# Patient Record
Sex: Male | Born: 1970 | Race: White | Hispanic: No | Marital: Married | Smoking: Never smoker
Health system: Southern US, Community
[De-identification: ages and names within clinical notes are randomized; demographics above are authoritative.]

## PROBLEM LIST (undated history)

## (undated) DIAGNOSIS — R7303 Prediabetes: Secondary | ICD-10-CM

## (undated) DIAGNOSIS — I10 Essential (primary) hypertension: Secondary | ICD-10-CM

## (undated) DIAGNOSIS — M9112 Juvenile osteochondrosis of head of femur [Legg-Calve-Perthes], left leg: Secondary | ICD-10-CM

## (undated) DIAGNOSIS — G4730 Sleep apnea, unspecified: Secondary | ICD-10-CM

## (undated) HISTORY — PX: HIP SURGERY: SHX245

---
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
## 2014-05-19 ENCOUNTER — Encounter (HOSPITAL_COMMUNITY): Payer: Self-pay | Admitting: Emergency Medicine

## 2014-05-19 ENCOUNTER — Emergency Department (HOSPITAL_COMMUNITY): Payer: Managed Care, Other (non HMO)

## 2014-05-19 ENCOUNTER — Emergency Department (HOSPITAL_COMMUNITY)
Admission: EM | Admit: 2014-05-19 | Discharge: 2014-05-20 | Disposition: A | Discharge status: Home / Self Care | Payer: Managed Care, Other (non HMO) | Attending: Emergency Medicine | Admitting: Emergency Medicine

## 2014-05-19 DIAGNOSIS — R519 Headache, unspecified: Secondary | ICD-10-CM

## 2014-05-19 DIAGNOSIS — G4700 Insomnia, unspecified: Secondary | ICD-10-CM | POA: Insufficient documentation

## 2014-05-19 DIAGNOSIS — Z79899 Other long term (current) drug therapy: Secondary | ICD-10-CM | POA: Insufficient documentation

## 2014-05-19 DIAGNOSIS — R0602 Shortness of breath: Secondary | ICD-10-CM | POA: Insufficient documentation

## 2014-05-19 DIAGNOSIS — M542 Cervicalgia: Secondary | ICD-10-CM | POA: Diagnosis not present

## 2014-05-19 DIAGNOSIS — R079 Chest pain, unspecified: Secondary | ICD-10-CM | POA: Insufficient documentation

## 2014-05-19 DIAGNOSIS — R51 Headache: Secondary | ICD-10-CM | POA: Insufficient documentation

## 2014-05-19 LAB — BASIC METABOLIC PANEL
Anion gap: 8 (ref 5–15)
BUN: 13 mg/dL (ref 6–23)
CALCIUM: 9.6 mg/dL (ref 8.4–10.5)
CO2: 28 mmol/L (ref 19–32)
Chloride: 102 mmol/L (ref 96–112)
Creatinine, Ser: 0.86 mg/dL (ref 0.50–1.35)
GFR calc Af Amer: >90 mL/min (ref >90)
Glucose, Bld: 98 mg/dL (ref 70–99)
Potassium: 3.5 mmol/L (ref 3.5–5.1)
SODIUM: 138 mmol/L (ref 135–145)

## 2014-05-19 LAB — CBC
HCT: 43.8 % (ref 39.0–52.0)
HEMOGLOBIN: 15.1 g/dL (ref 13.0–17.0)
MCH: 30.3 pg (ref 26.0–34.0)
MCHC: 34.5 g/dL (ref 30.0–36.0)
MCV: 88 fL (ref 78.0–100.0)
Platelets: 294 10*3/uL (ref 150–400)
RBC: 4.98 MIL/uL (ref 4.22–5.81)
RDW: 12.7 % (ref 11.5–15.5)
WBC: 6.5 10*3/uL (ref 4.0–10.5)

## 2014-05-19 LAB — TROPONIN I

## 2014-05-19 MED ORDER — ASPIRIN 81 MG PO CHEW
324.0000 mg | CHEWABLE_TABLET | Freq: Once | ORAL | Status: AC
  Administered 2014-05-19: 324 mg via ORAL
  Filled 2014-05-19: qty 4

## 2014-05-19 NOTE — ED Notes (Signed)
Pt reports high blood pressure,dyspnea,headache,blurred vision since Tuesday. Pt reports chest tightness since this am. Pt non-diaphoretic.

## 2014-05-19 NOTE — ED Notes (Signed)
MD at bedside. 

## 2014-05-19 NOTE — ED Provider Notes (Addendum)
CSN: [id number]     Arrival date & time 05/19/14  1211 History  This chart was scribed for Nicholas Sorrow, MD by Peyton Bottoms, ED Scribe. This patient was seen in room APA04/APA04 and the patient's care was started at 1:56 PM.   Chief Complaint  Patient presents with  . Chest Pain   Patient is a 44 y.o. male presenting with chest pain. The history is provided by the patient. No language interpreter was used.  Chest Pain Pain location:  Unable to specify Pain quality: tightness   Pain radiates to:  Does not radiate Pain radiates to the back: no   Pain severity:  Moderate Onset quality:  Gradual Duration:  1 day Timing:  Constant Progression:  Waxing and waning Chronicity:  New Relieved by:  Nothing Worsened by:  Nothing tried Associated symptoms: headache and shortness of breath   Associated symptoms: no abdominal pain, no back pain, no cough, no fever, no nausea and not vomiting    HPI Comments: Nicholas Newton is a 44 y.o. male with a PMHx of hip surgery, and diabetes, who presents to the Emergency Department complaining of moderate headache and neck stiffness that began 4 days ago. Patient also reports associated onset of blurry vision which began later that day. His BP was around 150/64. Patient states that headache, neck stiffness and blurry vision worsened earlier today. He also had onset of constant substernal chest pain and tightness that began earlier today at 9 AM. He states that the pain has been constant until after arriving to ED. He reports associated SOB. He states he checked his BP at pharmacy earlier today and states that it was high. He states that he was seen by PCP 5 days ago and his BP was normal. Patient report family history of heart problems. He denies associated nausea or vomiting. Patient states he began taking Metformin 3 days ago. Patient denies use of anticoagulant medication.  History reviewed. No pertinent past medical history. Past Surgical History   Procedure Laterality Date  . Hip surgery     History reviewed. No pertinent family history. History  Substance Use Topics  . Smoking status: Never Smoker   . Smokeless tobacco: Not on file  . Alcohol Use: No   Review of Systems  Constitutional: Negative for fever and chills.  HENT: Negative for congestion, rhinorrhea and sore throat.   Eyes: Positive for visual disturbance.  Respiratory: Positive for shortness of breath. Negative for cough.   Cardiovascular: Positive for chest pain. Negative for leg swelling.  Gastrointestinal: Negative for nausea, vomiting, abdominal pain and diarrhea.  Genitourinary: Negative for dysuria.  Musculoskeletal: Positive for neck pain and neck stiffness. Negative for back pain.  Skin: Negative for rash.  Neurological: Positive for headaches.  Hematological: Does not bruise/bleed easily.  Psychiatric/Behavioral: Negative for confusion.   Allergies  Review of patient's allergies indicates no known allergies.  Home Medications   Prior to Admission medications   Medication Sig Start Date End Date Taking? Authorizing Provider  amphetamine-dextroamphetamine (ADDERALL XR) 20 MG 24 hr capsule Take 20 mg by mouth daily. 05/02/14  Yes Historical Provider, MD  BIOTIN PO Take 1 tablet by mouth daily.   Yes Historical Provider, MD  FOLIC ACID PO Take 1 tablet by mouth daily.   Yes Historical Provider, MD  GARLIC PO Take 1 capsule by mouth daily.   Yes Historical Provider, MD  metFORMIN (GLUCOPHAGE) 500 MG tablet Take 500 mg by mouth daily. 05/16/14  Yes Historical Provider, MD  Omega-3 Fatty Acids (FISH OIL) 1200 MG CAPS Take 2,400 mg by mouth daily.   Yes Historical Provider, MD  VITAMIN A PO Take 1 tablet by mouth daily.   Yes Historical Provider, MD  Vitamin D, Ergocalciferol, (DRISDOL) 50000 UNITS CAPS capsule Take 1 capsule by mouth once a week. Wednesday 04/09/14   Historical Provider, MD   Triage Vitals: BP 139/96 mmHg  Pulse 79  Resp 16  Ht 5' 11"   (1.803 m)  Wt 240 lb (108.863 kg)  BMI 33.49 kg/m2  SpO2 98%  Physical Exam  Constitutional: He is oriented to person, place, and time. He appears well-developed and well-nourished.  HENT:  Head: Normocephalic and atraumatic.  Mouth/Throat: Oropharynx is clear and moist. No oropharyngeal exudate.  Eyes: EOM are normal. Pupils are equal, round, and reactive to light. No scleral icterus.  Cardiovascular: Normal rate, regular rhythm and normal heart sounds.   No murmur heard. Pulmonary/Chest: Effort normal and breath sounds normal.  Abdominal: Soft. Bowel sounds are normal. There is no tenderness.  Musculoskeletal: Normal range of motion. He exhibits no edema or tenderness.  Neurological: He is alert and oriented to person, place, and time. No cranial nerve deficit. He exhibits normal muscle tone. Coordination normal.  Skin: Skin is warm and dry.  Psychiatric: He has a normal mood and affect. His behavior is normal.  Nursing note and vitals reviewed.  ED Course  Procedures (including critical care time)]   Medications  aspirin chewable tablet 324 mg (324 mg Oral Given 05/19/14 1427)    DIAGNOSTIC STUDIES: Oxygen Saturation is 98% on RA, normal by my interpretation.    COORDINATION OF CARE: 2:05 PM- Discussed plans to order diagnostic CXR, EKG and lab work. Pt advised of plan for treatment and pt agrees.  Results for orders placed or performed during the hospital encounter of 05/19/14  CBC  Result Value Ref Range   WBC 6.5 4.0 - 10.5 K/uL   RBC 4.98 4.22 - 5.81 MIL/uL   Hemoglobin 15.1 13.0 - 17.0 g/dL   HCT 43.8 39.0 - 52.0 %   MCV 88.0 78.0 - 100.0 fL   MCH 30.3 26.0 - 34.0 pg   MCHC 34.5 30.0 - 36.0 g/dL   RDW 12.7 11.5 - 15.5 %   Platelets 294 150 - 400 K/uL  Basic metabolic panel  Result Value Ref Range   Sodium 138 135 - 145 mmol/L   Potassium 3.5 3.5 - 5.1 mmol/L   Chloride 102 96 - 112 mmol/L   CO2 28 19 - 32 mmol/L   Glucose, Bld 98 70 - 99 mg/dL   BUN 13 6 -  23 mg/dL   Creatinine, Ser 0.86 0.50 - 1.35 mg/dL   Calcium 9.6 8.4 - 10.5 mg/dL   GFR calc non Af Amer >90 >90 mL/min   GFR calc Af Amer >90 >90 mL/min   Anion gap 8 5 - 15  Troponin I (MHP)  Result Value Ref Range   Troponin I <0.03 <0.031 ng/mL  Troponin I  Result Value Ref Range   Troponin I <0.03 <0.031 ng/mL   Ct Head Wo Contrast  05/19/2014   CLINICAL DATA:  Headache.  EXAM: CT HEAD WITHOUT CONTRAST  TECHNIQUE: Contiguous axial images were obtained from the base of the skull through the vertex without intravenous contrast.  COMPARISON:  None.  FINDINGS: Bony calvarium appears intact. No mass effect or midline shift is noted. Ventricular size is within normal limits. There is no evidence of intracranial mass  lesion, hemorrhage or acute infarction. 1.7 cm subcutaneous lesion is seen over left frontal skull which most likely is benign.  IMPRESSION: No acute intracranial abnormality seen.   Electronically Signed   By: Sabino Dick M.D.   On: 05/19/2014 14:55   Dg Chest Port 1 View  05/19/2014   CLINICAL DATA:  Acute chest pain.  EXAM: PORTABLE CHEST - 1 VIEW  COMPARISON:  None.  FINDINGS: The heart size and mediastinal contours are within normal limits. Both lungs are clear. No pneumothorax or pleural effusion is noted. The visualized skeletal structures are unremarkable.  IMPRESSION: No acute cardiopulmonary abnormality seen.   Electronically Signed   By: Sabino Dick M.D.   On: 05/19/2014 12:53   MDM   Final diagnoses:  Headache  Chest pain, unspecified chest pain type    Patient meets criteria for admission due to the chest pain. Patient with onset of chest pain 9 this morning still present but improved after aspirin. Patient has risk factors of age male and diabetes. Patient refuses admission he understands the potential consequences worse case scenario could result in death from a cardiac event. Patient still once to go home and will sign out AMA.  Patients first 2 troponins were  negative which is good EKG also had no acute changes. Workup for the headache CT negative. That all suggestive of perhaps a viral type illness has been bothering for the past few weeks. Blood pressure improved significantly here without any direct treatment. Systolic down to 683. Patient does have primary care doctor to follow-up with.  I personally performed the services described in this documentation, which was scribed in my presence. The recorded information has been reviewed and is accurate.  Nicholas Sorrow, MD 05/19/14 [place], MD 05/19/14 [phone]

## 2014-05-19 NOTE — ED Notes (Signed)
Pt walked out after doctor spoke with him about admission.

## 2014-06-07 ENCOUNTER — Encounter (INDEPENDENT_AMBULATORY_CARE_PROVIDER_SITE_OTHER): Payer: Self-pay | Admitting: *Deleted

## 2014-06-27 ENCOUNTER — Encounter (INDEPENDENT_AMBULATORY_CARE_PROVIDER_SITE_OTHER): Payer: Self-pay | Admitting: Internal Medicine

## 2014-06-27 ENCOUNTER — Ambulatory Visit (INDEPENDENT_AMBULATORY_CARE_PROVIDER_SITE_OTHER): Payer: Managed Care, Other (non HMO) | Admitting: Internal Medicine

## 2014-06-27 VITALS — BP 108/62 | HR 72 | Temp 97.1°F | Ht 71.0 in | Wt 241.1 lb

## 2014-06-27 DIAGNOSIS — I10 Essential (primary) hypertension: Secondary | ICD-10-CM

## 2014-06-27 DIAGNOSIS — K625 Hemorrhage of anus and rectum: Secondary | ICD-10-CM

## 2014-06-27 NOTE — Progress Notes (Signed)
   Subjective:    Patient ID: Nicholas Newton, male    DOB: 07/07/1970, 44 y.o.   MRN: [id number]  HPI Referred to our office by Micheline Rough for blood in stool. ([place]). Underwent physical in January and found to have Heme positive stool card. He tells me he occasionally see blood in his stools. Occurs once every 2-3 weeks.  Has had this rectal bleeding off and on for about 2 yrs.  No family hx of colon cancer. Appetite is good. No weight loss.  No abdominal pain . Usually has a BM at least once a day and sometimes two a day.  Patient denies being a diabetic.    Review of Systems   Married, Two children in good health. Works at [employer].  No past medical history on file.    No Known Allergies    No past medical history on file.    No Known Allergies  Current Outpatient Prescriptions on File Prior to Visit  Medication Sig Dispense Refill  . FOLIC ACID PO Take 1 tablet by mouth daily.    Marland Kitchen GARLIC PO Take 1 capsule by mouth daily.    . Omega-3 Fatty Acids (FISH OIL) 1200 MG CAPS Take 2,400 mg by mouth daily.    . Vitamin D, Ergocalciferol, (DRISDOL) 50000 UNITS CAPS capsule Take 1 capsule by mouth once a week. Wednesday    . VITAMIN A PO Take 1 tablet by mouth daily. Ginger rool one a day     No current facility-administered medications on file prior to visit.         Objective:   Physical Exam Blood pressure 108/62, pulse 72, temperature 97.1 F (36.2 C), height 5' 11"  (1.803 m), weight 241 lb 1.6 oz (109.362 kg).  Alert and oriented. Skin warm and dry. Oral mucosa is moist.   . Sclera anicteric, conjunctivae is pink. Thyroid not enlarged. No cervical lymphadenopathy. Lungs clear. Heart regular rate and rhythm.  Abdomen is soft. Bowel sounds are positive. No hepatomegaly. No abdominal masses felt. No tenderness.  No edema to lower extremities.        Assessment & Plan:  Blood in stool. Colonic neoplasm needs to be ruled. Hemorrhoids also in the  differential.  Colonoscopy: The risks and benefits such as perforation, bleeding, and infection were reviewed with the patient and is agreeable.

## 2014-06-27 NOTE — Patient Instructions (Signed)
Colonoscopy.  The risks and benefits such as perforation, bleeding, and infection were reviewed with the patient and is agreeable. 

## 2014-06-28 ENCOUNTER — Other Ambulatory Visit (INDEPENDENT_AMBULATORY_CARE_PROVIDER_SITE_OTHER): Payer: Self-pay | Admitting: *Deleted

## 2014-06-28 ENCOUNTER — Telehealth (INDEPENDENT_AMBULATORY_CARE_PROVIDER_SITE_OTHER): Payer: Self-pay | Admitting: *Deleted

## 2014-06-28 DIAGNOSIS — R195 Other fecal abnormalities: Secondary | ICD-10-CM

## 2014-06-28 DIAGNOSIS — Z1211 Encounter for screening for malignant neoplasm of colon: Secondary | ICD-10-CM

## 2014-06-28 DIAGNOSIS — K625 Hemorrhage of anus and rectum: Secondary | ICD-10-CM

## 2014-06-28 MED ORDER — PEG 3350-KCL-NA BICARB-NACL 420 G PO SOLR: 4000.0000 mL | Freq: Once | ORAL | Status: DC

## 2014-06-28 NOTE — Telephone Encounter (Signed)
Patient needs trilyte 

## 2014-07-24 ENCOUNTER — Telehealth (INDEPENDENT_AMBULATORY_CARE_PROVIDER_SITE_OTHER): Payer: Self-pay | Admitting: *Deleted

## 2014-07-24 NOTE — Telephone Encounter (Signed)
Patient called to cancel TCS for this Thursday (4/7) -- states it will cost him 400.00 and he can't afford this, states "he thought it would be free", TCS has been canceled

## 2014-07-25 NOTE — Telephone Encounter (Signed)
noted 

## 2014-07-26 ENCOUNTER — Ambulatory Visit (HOSPITAL_COMMUNITY)
Admission: RE | Admit: 2014-07-26 | Payer: Managed Care, Other (non HMO) | Source: Ambulatory Visit | Admitting: Internal Medicine

## 2014-07-26 ENCOUNTER — Encounter (HOSPITAL_COMMUNITY): Admission: RE | Payer: Self-pay | Source: Ambulatory Visit

## 2014-07-26 SURGERY — COLONOSCOPY
Anesthesia: Moderate Sedation

## 2015-12-09 ENCOUNTER — Ambulatory Visit: Payer: Managed Care, Other (non HMO) | Attending: Internal Medicine | Admitting: Neurology

## 2015-12-09 DIAGNOSIS — G4710 Hypersomnia, unspecified: Secondary | ICD-10-CM

## 2015-12-09 DIAGNOSIS — G4733 Obstructive sleep apnea (adult) (pediatric): Secondary | ICD-10-CM | POA: Insufficient documentation

## 2015-12-15 NOTE — Procedures (Signed)
Dayton A. Merlene Laughter, MD     [email or web address]             NOCTURNAL POLYSOMNOGRAPHY   LOCATION: [facility]  Patient Name: Nicholas Newton, Nicholas Newton Date: 12/09/2015 Gender: Male D.O.B: 1970/09/09 Age (years): 45 Referring Provider: Delphina Cahill Height (inches): 70 Interpreting Physician: Phillips Odor MD, ABSM Weight (lbs): 235 RPSGT: Rosebud Poles BMI: 34 MRN: [id number] Neck Size: 17.00 CLINICAL INFORMATION Sleep Study Type: Split Night CPAP Indication for sleep study: N/A Epworth Sleepiness Score: 5 SLEEP STUDY TECHNIQUE As per the AASM Manual for the Scoring of Sleep and Associated Events v2.3 (April 2016) with a hypopnea requiring 4% desaturations. The channels recorded and monitored were frontal, central and occipital EEG, electrooculogram (EOG), submentalis EMG (chin), nasal and oral airflow, thoracic and abdominal wall motion, anterior tibialis EMG, snore microphone, electrocardiogram, and pulse oximetry. Continuous positive airway pressure (CPAP) was initiated when the patient met split night criteria and was titrated according to treat sleep-disordered breathing. MEDICATIONS Medications taken by the patient : N/A Medications administered by patient during sleep study : No sleep medicine administered.  Current Outpatient Prescriptions:  .  FOLIC ACID PO, Take 1 tablet by mouth daily., Disp: , Rfl:  .  GARLIC PO, Take 1 capsule by mouth daily., Disp: , Rfl:  .  Omega-3 Fatty Acids (FISH OIL) 1200 MG CAPS, Take 2,400 mg by mouth daily., Disp: , Rfl:  .  polyethylene glycol-electrolytes (NULYTELY/GOLYTELY) 420 G solution, Take 4,000 mLs by mouth once., Disp: 4000 mL, Rfl: 0 .  VITAMIN A PO, Take 1 tablet by mouth daily. Ginger rool one a day, Disp: , Rfl:  .  Vitamin D, Ergocalciferol, (DRISDOL) 50000 UNITS CAPS capsule, Take 1 capsule by mouth once a week. Wednesday, Disp: , Rfl:   RESPIRATORY PARAMETERS Diagnostic Total AHI (/hr): 19.4 RDI  (/hr): 19.4 OA Index (/hr): 7.4 CA Index (/hr): 0.0 REM AHI (/hr): 3.7 NREM AHI (/hr): 22.3 Supine AHI (/hr): 20.2 Non-supine AHI (/hr): 17.64 Min O2 Sat (%): 81.00 Mean O2 (%): 95.27 Time below 88% (min): 5.9   Titration Optimal Pressure (cm): 10 AHI at Optimal Pressure (/hr): 0.4 Min O2 at Optimal Pressure (%): 93.0 Supine % at Optimal (%): 80 Sleep % at Optimal (%): 92   SLEEP ARCHITECTURE The recording time for the entire night was 405.7 minutes. During a baseline period of 405.7 minutes, the patient slept for 318.3 minutes in REM and nonREM, yielding a sleep efficiency of 78.5%. Sleep onset after lights out was 34.3 minutes with a REM latency of 99.5 minutes. The patient spent 3.93% of the night in stage N1 sleep, 54.61% in stage N2 sleep, 26.07% in stage N3 and 15.39% in REM. During the titration period of 0.0 minutes, the patient slept for 0.0 minutes in REM and nonREM, yielding a sleep efficiency of N/A%. Sleep onset after CPAP initiation was N/A minutes with a REM latency of N/A minutes. The patient spent N/A% of the night in stage N1 sleep, N/A% in stage N2 sleep, N/A% in stage N3 and N/A% in REM. CARDIAC DATA The 2 lead EKG demonstrated sinus rhythm. The mean heart rate was N/A beats per minute. Other EKG findings include: None. LEG MOVEMENT DATA The total Periodic Limb Movements of Sleep (PLMS) were 0. The PLMS index was 0.00.    IMPRESSIONS - Moderate obstructive sleep apnea occurred during the diagnostic portion of the study(AHI = 19.4/hour). An optimal PAP pressure was selected for this patient ( 10 cm of water).  Delano Metz, MD Diplomate, American Board of Sleep Medicine.

## 2017-11-17 DIAGNOSIS — M9903 Segmental and somatic dysfunction of lumbar region: Secondary | ICD-10-CM | POA: Diagnosis not present

## 2017-11-17 DIAGNOSIS — M542 Cervicalgia: Secondary | ICD-10-CM | POA: Diagnosis not present

## 2017-11-17 DIAGNOSIS — M9901 Segmental and somatic dysfunction of cervical region: Secondary | ICD-10-CM | POA: Diagnosis not present

## 2018-01-11 DIAGNOSIS — I10 Essential (primary) hypertension: Secondary | ICD-10-CM | POA: Diagnosis not present

## 2018-01-11 DIAGNOSIS — E782 Mixed hyperlipidemia: Secondary | ICD-10-CM | POA: Diagnosis not present

## 2018-01-11 DIAGNOSIS — E291 Testicular hypofunction: Secondary | ICD-10-CM | POA: Diagnosis not present

## 2018-01-11 DIAGNOSIS — R7301 Impaired fasting glucose: Secondary | ICD-10-CM | POA: Diagnosis not present

## 2018-01-14 DIAGNOSIS — Z23 Encounter for immunization: Secondary | ICD-10-CM | POA: Diagnosis not present

## 2018-01-14 DIAGNOSIS — E782 Mixed hyperlipidemia: Secondary | ICD-10-CM | POA: Diagnosis not present

## 2018-01-14 DIAGNOSIS — R7301 Impaired fasting glucose: Secondary | ICD-10-CM | POA: Diagnosis not present

## 2018-01-14 DIAGNOSIS — E291 Testicular hypofunction: Secondary | ICD-10-CM | POA: Diagnosis not present

## 2018-02-08 DIAGNOSIS — M545 Low back pain: Secondary | ICD-10-CM | POA: Diagnosis not present

## 2018-02-08 DIAGNOSIS — M9903 Segmental and somatic dysfunction of lumbar region: Secondary | ICD-10-CM | POA: Diagnosis not present

## 2018-02-08 DIAGNOSIS — M9901 Segmental and somatic dysfunction of cervical region: Secondary | ICD-10-CM | POA: Diagnosis not present

## 2018-02-14 DIAGNOSIS — M9901 Segmental and somatic dysfunction of cervical region: Secondary | ICD-10-CM | POA: Diagnosis not present

## 2018-02-14 DIAGNOSIS — M545 Low back pain: Secondary | ICD-10-CM | POA: Diagnosis not present

## 2018-02-14 DIAGNOSIS — M9903 Segmental and somatic dysfunction of lumbar region: Secondary | ICD-10-CM | POA: Diagnosis not present

## 2018-02-22 DIAGNOSIS — M545 Low back pain: Secondary | ICD-10-CM | POA: Diagnosis not present

## 2018-02-22 DIAGNOSIS — M9903 Segmental and somatic dysfunction of lumbar region: Secondary | ICD-10-CM | POA: Diagnosis not present

## 2018-02-22 DIAGNOSIS — M9901 Segmental and somatic dysfunction of cervical region: Secondary | ICD-10-CM | POA: Diagnosis not present

## 2018-03-02 DIAGNOSIS — M545 Low back pain: Secondary | ICD-10-CM | POA: Diagnosis not present

## 2018-03-02 DIAGNOSIS — M9901 Segmental and somatic dysfunction of cervical region: Secondary | ICD-10-CM | POA: Diagnosis not present

## 2018-03-02 DIAGNOSIS — M9903 Segmental and somatic dysfunction of lumbar region: Secondary | ICD-10-CM | POA: Diagnosis not present

## 2018-03-30 DIAGNOSIS — M9901 Segmental and somatic dysfunction of cervical region: Secondary | ICD-10-CM | POA: Diagnosis not present

## 2018-03-30 DIAGNOSIS — M545 Low back pain: Secondary | ICD-10-CM | POA: Diagnosis not present

## 2018-03-30 DIAGNOSIS — M9903 Segmental and somatic dysfunction of lumbar region: Secondary | ICD-10-CM | POA: Diagnosis not present

## 2018-05-10 DIAGNOSIS — E782 Mixed hyperlipidemia: Secondary | ICD-10-CM | POA: Diagnosis not present

## 2018-05-10 DIAGNOSIS — R945 Abnormal results of liver function studies: Secondary | ICD-10-CM | POA: Diagnosis not present

## 2018-05-10 DIAGNOSIS — I10 Essential (primary) hypertension: Secondary | ICD-10-CM | POA: Diagnosis not present

## 2018-05-10 DIAGNOSIS — R7301 Impaired fasting glucose: Secondary | ICD-10-CM | POA: Diagnosis not present

## 2018-05-10 DIAGNOSIS — E291 Testicular hypofunction: Secondary | ICD-10-CM | POA: Diagnosis not present

## 2018-05-13 DIAGNOSIS — M545 Low back pain: Secondary | ICD-10-CM | POA: Diagnosis not present

## 2018-05-13 DIAGNOSIS — R7301 Impaired fasting glucose: Secondary | ICD-10-CM | POA: Diagnosis not present

## 2018-05-13 DIAGNOSIS — Z6833 Body mass index (BMI) 33.0-33.9, adult: Secondary | ICD-10-CM | POA: Diagnosis not present

## 2018-05-13 DIAGNOSIS — E291 Testicular hypofunction: Secondary | ICD-10-CM | POA: Diagnosis not present

## 2018-05-13 DIAGNOSIS — I10 Essential (primary) hypertension: Secondary | ICD-10-CM | POA: Diagnosis not present

## 2018-05-13 DIAGNOSIS — E782 Mixed hyperlipidemia: Secondary | ICD-10-CM | POA: Diagnosis not present

## 2018-09-19 DIAGNOSIS — I10 Essential (primary) hypertension: Secondary | ICD-10-CM | POA: Diagnosis not present

## 2018-09-19 DIAGNOSIS — R945 Abnormal results of liver function studies: Secondary | ICD-10-CM | POA: Diagnosis not present

## 2018-09-19 DIAGNOSIS — E782 Mixed hyperlipidemia: Secondary | ICD-10-CM | POA: Diagnosis not present

## 2018-09-19 DIAGNOSIS — R7301 Impaired fasting glucose: Secondary | ICD-10-CM | POA: Diagnosis not present

## 2018-09-19 DIAGNOSIS — E291 Testicular hypofunction: Secondary | ICD-10-CM | POA: Diagnosis not present

## 2018-09-30 DIAGNOSIS — E782 Mixed hyperlipidemia: Secondary | ICD-10-CM | POA: Diagnosis not present

## 2018-09-30 DIAGNOSIS — R7301 Impaired fasting glucose: Secondary | ICD-10-CM | POA: Diagnosis not present

## 2018-09-30 DIAGNOSIS — E291 Testicular hypofunction: Secondary | ICD-10-CM | POA: Diagnosis not present

## 2018-09-30 DIAGNOSIS — I10 Essential (primary) hypertension: Secondary | ICD-10-CM | POA: Diagnosis not present

## 2018-09-30 DIAGNOSIS — M545 Low back pain: Secondary | ICD-10-CM | POA: Diagnosis not present

## 2019-03-28 DIAGNOSIS — R7301 Impaired fasting glucose: Secondary | ICD-10-CM | POA: Diagnosis not present

## 2019-03-28 DIAGNOSIS — I10 Essential (primary) hypertension: Secondary | ICD-10-CM | POA: Diagnosis not present

## 2019-03-28 DIAGNOSIS — E782 Mixed hyperlipidemia: Secondary | ICD-10-CM | POA: Diagnosis not present

## 2019-03-28 DIAGNOSIS — E291 Testicular hypofunction: Secondary | ICD-10-CM | POA: Diagnosis not present

## 2019-04-05 DIAGNOSIS — M545 Low back pain: Secondary | ICD-10-CM | POA: Diagnosis not present

## 2019-04-05 DIAGNOSIS — E291 Testicular hypofunction: Secondary | ICD-10-CM | POA: Diagnosis not present

## 2019-04-05 DIAGNOSIS — E782 Mixed hyperlipidemia: Secondary | ICD-10-CM | POA: Diagnosis not present

## 2019-04-05 DIAGNOSIS — I10 Essential (primary) hypertension: Secondary | ICD-10-CM | POA: Diagnosis not present

## 2019-04-05 DIAGNOSIS — R7301 Impaired fasting glucose: Secondary | ICD-10-CM | POA: Diagnosis not present

## 2019-08-05 DIAGNOSIS — L239 Allergic contact dermatitis, unspecified cause: Secondary | ICD-10-CM | POA: Diagnosis not present

## 2019-10-02 DIAGNOSIS — E291 Testicular hypofunction: Secondary | ICD-10-CM | POA: Diagnosis not present

## 2019-10-02 DIAGNOSIS — E782 Mixed hyperlipidemia: Secondary | ICD-10-CM | POA: Diagnosis not present

## 2019-10-02 DIAGNOSIS — E6609 Other obesity due to excess calories: Secondary | ICD-10-CM | POA: Diagnosis not present

## 2019-10-02 DIAGNOSIS — M545 Low back pain: Secondary | ICD-10-CM | POA: Diagnosis not present

## 2019-10-02 DIAGNOSIS — I10 Essential (primary) hypertension: Secondary | ICD-10-CM | POA: Diagnosis not present

## 2019-10-02 DIAGNOSIS — M9110 Juvenile osteochondrosis of head of femur [Legg-Calve-Perthes], unspecified leg: Secondary | ICD-10-CM | POA: Diagnosis not present

## 2019-10-02 DIAGNOSIS — G4710 Hypersomnia, unspecified: Secondary | ICD-10-CM | POA: Diagnosis not present

## 2019-10-02 DIAGNOSIS — L600 Ingrowing nail: Secondary | ICD-10-CM | POA: Diagnosis not present

## 2019-10-02 DIAGNOSIS — L239 Allergic contact dermatitis, unspecified cause: Secondary | ICD-10-CM | POA: Diagnosis not present

## 2019-10-09 DIAGNOSIS — I10 Essential (primary) hypertension: Secondary | ICD-10-CM | POA: Diagnosis not present

## 2019-10-09 DIAGNOSIS — R7301 Impaired fasting glucose: Secondary | ICD-10-CM | POA: Diagnosis not present

## 2019-10-09 DIAGNOSIS — E782 Mixed hyperlipidemia: Secondary | ICD-10-CM | POA: Diagnosis not present

## 2019-10-09 DIAGNOSIS — M545 Low back pain: Secondary | ICD-10-CM | POA: Diagnosis not present

## 2019-10-09 DIAGNOSIS — E291 Testicular hypofunction: Secondary | ICD-10-CM | POA: Diagnosis not present

## 2020-08-27 DIAGNOSIS — R519 Headache, unspecified: Secondary | ICD-10-CM | POA: Diagnosis not present

## 2020-08-27 DIAGNOSIS — I10 Essential (primary) hypertension: Secondary | ICD-10-CM | POA: Diagnosis not present

## 2020-08-27 DIAGNOSIS — R232 Flushing: Secondary | ICD-10-CM | POA: Diagnosis not present

## 2020-09-18 DIAGNOSIS — R7301 Impaired fasting glucose: Secondary | ICD-10-CM | POA: Diagnosis not present

## 2020-09-18 DIAGNOSIS — I10 Essential (primary) hypertension: Secondary | ICD-10-CM | POA: Diagnosis not present

## 2020-10-02 DIAGNOSIS — Z0001 Encounter for general adult medical examination with abnormal findings: Secondary | ICD-10-CM | POA: Diagnosis not present

## 2020-10-02 DIAGNOSIS — Z1211 Encounter for screening for malignant neoplasm of colon: Secondary | ICD-10-CM | POA: Diagnosis not present

## 2020-10-02 DIAGNOSIS — R7303 Prediabetes: Secondary | ICD-10-CM | POA: Diagnosis not present

## 2020-10-02 DIAGNOSIS — E291 Testicular hypofunction: Secondary | ICD-10-CM | POA: Diagnosis not present

## 2020-10-02 DIAGNOSIS — I10 Essential (primary) hypertension: Secondary | ICD-10-CM | POA: Diagnosis not present

## 2020-10-02 DIAGNOSIS — M545 Low back pain, unspecified: Secondary | ICD-10-CM | POA: Diagnosis not present

## 2020-10-02 DIAGNOSIS — E782 Mixed hyperlipidemia: Secondary | ICD-10-CM | POA: Diagnosis not present

## 2020-10-14 DIAGNOSIS — Z1211 Encounter for screening for malignant neoplasm of colon: Secondary | ICD-10-CM | POA: Diagnosis not present

## 2020-10-20 LAB — COLOGUARD: COLOGUARD: NEGATIVE

## 2021-04-04 DIAGNOSIS — R7301 Impaired fasting glucose: Secondary | ICD-10-CM | POA: Diagnosis not present

## 2021-04-04 DIAGNOSIS — I10 Essential (primary) hypertension: Secondary | ICD-10-CM | POA: Diagnosis not present

## 2021-04-10 ENCOUNTER — Other Ambulatory Visit (HOSPITAL_COMMUNITY): Payer: Self-pay | Admitting: Family Medicine

## 2021-04-10 ENCOUNTER — Other Ambulatory Visit: Payer: Self-pay

## 2021-04-10 ENCOUNTER — Ambulatory Visit (HOSPITAL_COMMUNITY)
Admission: RE | Admit: 2021-04-10 | Discharge: 2021-04-10 | Disposition: A | Discharge status: Home / Self Care | Payer: 59 | Source: Ambulatory Visit | Attending: Family Medicine | Admitting: Family Medicine

## 2021-04-10 DIAGNOSIS — I10 Essential (primary) hypertension: Secondary | ICD-10-CM | POA: Diagnosis not present

## 2021-04-10 DIAGNOSIS — E782 Mixed hyperlipidemia: Secondary | ICD-10-CM | POA: Diagnosis not present

## 2021-04-10 DIAGNOSIS — E669 Obesity, unspecified: Secondary | ICD-10-CM | POA: Diagnosis not present

## 2021-04-10 DIAGNOSIS — R7303 Prediabetes: Secondary | ICD-10-CM | POA: Diagnosis not present

## 2021-04-10 DIAGNOSIS — M545 Low back pain, unspecified: Secondary | ICD-10-CM | POA: Diagnosis not present

## 2021-04-10 DIAGNOSIS — M47816 Spondylosis without myelopathy or radiculopathy, lumbar region: Secondary | ICD-10-CM | POA: Diagnosis not present

## 2021-04-10 DIAGNOSIS — E291 Testicular hypofunction: Secondary | ICD-10-CM | POA: Diagnosis not present

## 2021-04-10 DIAGNOSIS — M5442 Lumbago with sciatica, left side: Secondary | ICD-10-CM

## 2021-04-28 DIAGNOSIS — M5442 Lumbago with sciatica, left side: Secondary | ICD-10-CM | POA: Diagnosis not present

## 2021-04-28 DIAGNOSIS — I10 Essential (primary) hypertension: Secondary | ICD-10-CM | POA: Diagnosis not present

## 2021-04-28 DIAGNOSIS — Z6834 Body mass index (BMI) 34.0-34.9, adult: Secondary | ICD-10-CM | POA: Diagnosis not present

## 2021-05-02 ENCOUNTER — Other Ambulatory Visit: Payer: Self-pay | Admitting: Neurosurgery

## 2021-05-02 ENCOUNTER — Other Ambulatory Visit (HOSPITAL_COMMUNITY): Payer: Self-pay | Admitting: Neurosurgery

## 2021-05-02 DIAGNOSIS — M5442 Lumbago with sciatica, left side: Secondary | ICD-10-CM

## 2021-05-15 ENCOUNTER — Encounter (HOSPITAL_COMMUNITY): Payer: Self-pay

## 2021-05-15 ENCOUNTER — Ambulatory Visit (HOSPITAL_COMMUNITY): Payer: 59

## 2021-06-06 ENCOUNTER — Other Ambulatory Visit: Payer: Self-pay

## 2021-06-06 ENCOUNTER — Ambulatory Visit (HOSPITAL_COMMUNITY)
Admission: RE | Admit: 2021-06-06 | Discharge: 2021-06-06 | Disposition: A | Discharge status: Home / Self Care | Payer: No Typology Code available for payment source | Source: Ambulatory Visit | Attending: Neurosurgery | Admitting: Neurosurgery

## 2021-06-06 DIAGNOSIS — M5442 Lumbago with sciatica, left side: Secondary | ICD-10-CM | POA: Insufficient documentation

## 2021-06-06 DIAGNOSIS — M5440 Lumbago with sciatica, unspecified side: Secondary | ICD-10-CM | POA: Diagnosis not present

## 2021-06-06 DIAGNOSIS — M545 Low back pain, unspecified: Secondary | ICD-10-CM | POA: Diagnosis not present

## 2021-06-06 DIAGNOSIS — M48061 Spinal stenosis, lumbar region without neurogenic claudication: Secondary | ICD-10-CM | POA: Diagnosis not present

## 2021-06-06 DIAGNOSIS — M4316 Spondylolisthesis, lumbar region: Secondary | ICD-10-CM | POA: Diagnosis not present

## 2021-06-12 DIAGNOSIS — Z6833 Body mass index (BMI) 33.0-33.9, adult: Secondary | ICD-10-CM | POA: Diagnosis not present

## 2021-06-12 DIAGNOSIS — M48062 Spinal stenosis, lumbar region with neurogenic claudication: Secondary | ICD-10-CM | POA: Diagnosis not present

## 2021-07-03 DIAGNOSIS — M48062 Spinal stenosis, lumbar region with neurogenic claudication: Secondary | ICD-10-CM | POA: Diagnosis not present

## 2021-08-05 DIAGNOSIS — M48062 Spinal stenosis, lumbar region with neurogenic claudication: Secondary | ICD-10-CM | POA: Diagnosis not present

## 2021-10-30 DIAGNOSIS — M48062 Spinal stenosis, lumbar region with neurogenic claudication: Secondary | ICD-10-CM | POA: Diagnosis not present

## 2021-10-30 DIAGNOSIS — Z6835 Body mass index (BMI) 35.0-35.9, adult: Secondary | ICD-10-CM | POA: Diagnosis not present

## 2021-11-05 ENCOUNTER — Other Ambulatory Visit: Payer: Self-pay | Admitting: Neurosurgery

## 2021-11-05 ENCOUNTER — Other Ambulatory Visit (HOSPITAL_COMMUNITY): Payer: Self-pay | Admitting: Neurosurgery

## 2021-11-05 DIAGNOSIS — M48062 Spinal stenosis, lumbar region with neurogenic claudication: Secondary | ICD-10-CM

## 2021-11-06 DIAGNOSIS — R7303 Prediabetes: Secondary | ICD-10-CM | POA: Diagnosis not present

## 2021-11-06 DIAGNOSIS — E291 Testicular hypofunction: Secondary | ICD-10-CM | POA: Diagnosis not present

## 2021-11-06 DIAGNOSIS — E782 Mixed hyperlipidemia: Secondary | ICD-10-CM | POA: Diagnosis not present

## 2021-11-12 ENCOUNTER — Other Ambulatory Visit (HOSPITAL_COMMUNITY): Payer: Self-pay | Admitting: Family Medicine

## 2021-11-12 ENCOUNTER — Other Ambulatory Visit: Payer: Self-pay | Admitting: Family Medicine

## 2021-11-12 DIAGNOSIS — Z8249 Family history of ischemic heart disease and other diseases of the circulatory system: Secondary | ICD-10-CM

## 2021-11-12 DIAGNOSIS — R7303 Prediabetes: Secondary | ICD-10-CM | POA: Diagnosis not present

## 2021-11-12 DIAGNOSIS — E669 Obesity, unspecified: Secondary | ICD-10-CM | POA: Diagnosis not present

## 2021-11-12 DIAGNOSIS — E6609 Other obesity due to excess calories: Secondary | ICD-10-CM | POA: Diagnosis not present

## 2021-11-12 DIAGNOSIS — M5442 Lumbago with sciatica, left side: Secondary | ICD-10-CM | POA: Diagnosis not present

## 2021-11-12 DIAGNOSIS — Z6835 Body mass index (BMI) 35.0-35.9, adult: Secondary | ICD-10-CM | POA: Diagnosis not present

## 2021-11-12 DIAGNOSIS — E291 Testicular hypofunction: Secondary | ICD-10-CM | POA: Diagnosis not present

## 2021-11-12 DIAGNOSIS — I10 Essential (primary) hypertension: Secondary | ICD-10-CM | POA: Diagnosis not present

## 2021-11-12 DIAGNOSIS — E782 Mixed hyperlipidemia: Secondary | ICD-10-CM | POA: Diagnosis not present

## 2021-11-12 DIAGNOSIS — Z0001 Encounter for general adult medical examination with abnormal findings: Secondary | ICD-10-CM | POA: Diagnosis not present

## 2021-11-19 ENCOUNTER — Ambulatory Visit (HOSPITAL_COMMUNITY)
Admission: RE | Admit: 2021-11-19 | Discharge: 2021-11-19 | Disposition: A | Discharge status: Home / Self Care | Payer: No Typology Code available for payment source | Source: Ambulatory Visit | Attending: Neurosurgery | Admitting: Neurosurgery

## 2021-11-19 DIAGNOSIS — M48062 Spinal stenosis, lumbar region with neurogenic claudication: Secondary | ICD-10-CM | POA: Diagnosis not present

## 2021-11-19 DIAGNOSIS — R200 Anesthesia of skin: Secondary | ICD-10-CM | POA: Diagnosis not present

## 2021-11-19 DIAGNOSIS — M545 Low back pain, unspecified: Secondary | ICD-10-CM | POA: Diagnosis not present

## 2021-11-19 DIAGNOSIS — M4316 Spondylolisthesis, lumbar region: Secondary | ICD-10-CM | POA: Diagnosis not present

## 2021-11-20 DIAGNOSIS — Z6836 Body mass index (BMI) 36.0-36.9, adult: Secondary | ICD-10-CM | POA: Diagnosis not present

## 2021-11-20 DIAGNOSIS — M48062 Spinal stenosis, lumbar region with neurogenic claudication: Secondary | ICD-10-CM | POA: Diagnosis not present

## 2021-12-01 ENCOUNTER — Ambulatory Visit (HOSPITAL_COMMUNITY)
Admission: RE | Admit: 2021-12-01 | Discharge: 2021-12-01 | Disposition: A | Discharge status: Home / Self Care | Payer: 59 | Source: Ambulatory Visit | Attending: Family Medicine | Admitting: Family Medicine

## 2021-12-01 DIAGNOSIS — Z0389 Encounter for observation for other suspected diseases and conditions ruled out: Secondary | ICD-10-CM | POA: Insufficient documentation

## 2021-12-01 DIAGNOSIS — Z8249 Family history of ischemic heart disease and other diseases of the circulatory system: Secondary | ICD-10-CM | POA: Insufficient documentation

## 2022-01-19 ENCOUNTER — Other Ambulatory Visit: Payer: Self-pay | Admitting: Neurosurgery

## 2022-02-20 NOTE — Pre-Procedure Instructions (Signed)
Surgical Instructions    Your procedure is scheduled on March 04, 2022.  Report to [facility] Main Entrance "A" at 6:30 A.M., then check in with the Admitting office.  Call this number if you have problems the morning of surgery:  [phone]   If you have any questions prior to your surgery date call [phone]: Open Monday-Friday 8am-4pm    Remember:  Do not eat after midnight the night before your surgery  You may drink clear liquids until 5:30 AM the morning of your surgery.   Clear liquids allowed are: Water, Non-Citrus Juices (without pulp), Carbonated Beverages, Clear Tea, Black Coffee Only (NO MILK, CREAM OR POWDERED CREAMER of any kind), and Gatorade.      Take these medicines the morning of surgery with A SIP OF WATER:  fenofibrate   Artificial Tear Solution (SYSTANE CONTACTS) - may take if needed  cyclobenzaprine (FLEXERIL) - may take if needed   Follow your surgeon's instructions on when to stop Aspirin.  If no instructions were given by your surgeon then you will need to call the office to get those instructions.    As of today, STOP taking any Aleve, Naproxen, Ibuprofen, Motrin, Advil, Goody's, BC's, all herbal medications, fish oil, and all vitamins.                     Do NOT Smoke (Tobacco/Vaping) for 24 hours prior to your procedure.  If you use a CPAP at night, you may bring your mask/headgear for your overnight stay.   Contacts, glasses, piercing's, hearing aid's, dentures or partials may not be worn into surgery, please bring cases for these belongings.    For patients admitted to the hospital, discharge time will be determined by your treatment team.   Patients discharged the day of surgery will not be allowed to drive home, and someone needs to stay with them for 24 hours.  SURGICAL WAITING ROOM VISITATION Patients having surgery or a procedure may have no more than 2 support people in the waiting area - these visitors may rotate.   Children  under the age of 18 must have an adult with them who is not the patient. If the patient needs to stay at the hospital during part of their recovery, the visitor guidelines for inpatient rooms apply. Pre-op nurse will coordinate an appropriate time for 1 support person to accompany patient in pre-op.  This support person may not rotate.   Please refer to the [facility] website for the visitor guidelines for Inpatients (after your surgery is over and you are in a regular room).    Special instructions:   [facility]- Preparing For Surgery  Before surgery, you can play an important role. Because skin is not sterile, your skin needs to be as free of germs as possible. You can reduce the number of germs on your skin by washing with CHG (chlorahexidine gluconate) Soap before surgery.  CHG is an antiseptic cleaner which kills germs and bonds with the skin to continue killing germs even after washing.    Oral Hygiene is also important to reduce your risk of infection.  Remember - BRUSH YOUR TEETH THE MORNING OF SURGERY WITH YOUR REGULAR TOOTHPASTE  Please do not use if you have an allergy to CHG or antibacterial soaps. If your skin becomes reddened/irritated stop using the CHG.  Do not shave (including legs and underarms) for at least 48 hours prior to first CHG shower. It is OK to shave your face.  Please follow these instructions carefully.   Shower the NIGHT BEFORE SURGERY and the MORNING OF SURGERY  If you chose to wash your hair, wash your hair first as usual with your normal shampoo.  After you shampoo, rinse your hair and body thoroughly to remove the shampoo.  Use CHG Soap as you would any other liquid soap. You can apply CHG directly to the skin and wash gently with a scrungie or a clean washcloth.   Apply the CHG Soap to your body ONLY FROM THE NECK DOWN.  Do not use on open wounds or open sores. Avoid contact with your eyes, ears, mouth and genitals (private parts). Wash Face and  genitals (private parts)  with your normal soap.   Wash thoroughly, paying special attention to the area where your surgery will be performed.  Thoroughly rinse your body with warm water from the neck down.  DO NOT shower/wash with your normal soap after using and rinsing off the CHG Soap.  Pat yourself dry with a CLEAN TOWEL.  Wear CLEAN PAJAMAS to bed the night before surgery  Place CLEAN SHEETS on your bed the night before your surgery  DO NOT SLEEP WITH PETS.   Day of Surgery: Take a shower with CHG soap. Do not wear jewelry or makeup Do not wear lotions, powders, perfumes/colognes, or deodorant. Do not shave 48 hours prior to surgery.  Men may shave face and neck. Do not bring valuables to the hospital.  [facility] is not responsible for any belongings or valuables. Do not wear nail polish, gel polish, artificial nails, or any other type of covering on natural nails (fingers and toes) If you have artificial nails or gel coating that need to be removed by a nail salon, please have this removed prior to surgery. Artificial nails or gel coating may interfere with anesthesia's ability to adequately monitor your vital signs.  Wear Clean/Comfortable clothing the morning of surgery Remember to brush your teeth WITH YOUR REGULAR TOOTHPASTE.   Please read over the following fact sheets that you were given.    If you received a COVID test during your pre-op visit  it is requested that you wear a mask when out in public, stay away from anyone that may not be feeling well and notify your surgeon if you develop symptoms. If you have been in contact with anyone that has tested positive in the last 10 days please notify you surgeon.

## 2022-02-23 ENCOUNTER — Other Ambulatory Visit: Payer: Self-pay

## 2022-02-23 ENCOUNTER — Encounter (HOSPITAL_COMMUNITY): Payer: Self-pay

## 2022-02-23 ENCOUNTER — Encounter (HOSPITAL_COMMUNITY)
Admission: RE | Admit: 2022-02-23 | Discharge: 2022-02-23 | Disposition: A | Discharge status: Home / Self Care | Payer: No Typology Code available for payment source | Source: Ambulatory Visit | Attending: Neurosurgery | Admitting: Neurosurgery

## 2022-02-23 VITALS — BP 132/86 | HR 69 | Temp 98.3°F | Resp 18 | Ht 70.0 in | Wt 259.7 lb

## 2022-02-23 DIAGNOSIS — I2510 Atherosclerotic heart disease of native coronary artery without angina pectoris: Secondary | ICD-10-CM | POA: Insufficient documentation

## 2022-02-23 DIAGNOSIS — Z01818 Encounter for other preprocedural examination: Secondary | ICD-10-CM | POA: Diagnosis present

## 2022-02-23 HISTORY — DX: Juvenile osteochondrosis of head of femur (legg-calve-perthes), left leg: M91.12

## 2022-02-23 HISTORY — DX: Essential (primary) hypertension: I10

## 2022-02-23 HISTORY — DX: Prediabetes: R73.03

## 2022-02-23 LAB — BASIC METABOLIC PANEL
Anion gap: 6 (ref 5–15)
BUN: 15 mg/dL (ref 6–20)
CO2: 26 mmol/L (ref 22–32)
Calcium: 9.3 mg/dL (ref 8.9–10.3)
Chloride: 106 mmol/L (ref 98–111)
Creatinine, Ser: 0.92 mg/dL (ref 0.61–1.24)
GFR, Estimated: >60 mL/min (ref >60)
Glucose, Bld: 103 mg/dL — ABNORMAL HIGH (ref 70–99)
Potassium: 3.9 mmol/L (ref 3.5–5.1)
Sodium: 138 mmol/L (ref 135–145)

## 2022-02-23 LAB — CBC
HCT: 41.2 % (ref 39.0–52.0)
Hemoglobin: 14.1 g/dL (ref 13.0–17.0)
MCH: 30.9 pg (ref 26.0–34.0)
MCHC: 34.2 g/dL (ref 30.0–36.0)
MCV: 90.2 fL (ref 80.0–100.0)
Platelets: 324 10*3/uL (ref 150–400)
RBC: 4.57 MIL/uL (ref 4.22–5.81)
RDW: 13 % (ref 11.5–15.5)
WBC: 5.7 10*3/uL (ref 4.0–10.5)
nRBC: 0 % (ref 0.0–0.2)

## 2022-02-23 LAB — TYPE AND SCREEN
ABO/RH(D): B POS
Antibody Screen: NEGATIVE

## 2022-02-23 LAB — SURGICAL PCR SCREEN
MRSA, PCR: NEGATIVE
Staphylococcus aureus: NEGATIVE

## 2022-02-23 NOTE — Progress Notes (Signed)
PCP - Dr. Edwinna Areola. [place] Cardiologist - Pt saw Dr. Almira Coaster in 2016 for CP related to Metformin, once the patient was off the medication his symptoms went away. Did not need to see MD once symptoms resolved.   PPM/ICD - Denies Device Orders - n/a Rep Notified - n/a  Chest x-ray - n/a EKG - 02/23/2022 Stress Test - Denies ECHO - Denies Cardiac Cath - Denies  Sleep Study - Yes. Positive sleep study 3-4 years ago.  CPAP - Pt does not wear CPAP. Unable to tolerate mask  No DM. Pt has been told he is Pre-Diabetic  Last dose of GLP1 agonist- n/a GLP1 instructions: n/a  Blood Thinner Instructions: n/a Aspirin Instructions: Pt takes ASA daily for heart health even though it is not prescribed by a doctor. Instructions given to reach out to Dr. Alphonzo [place] office today and receive instructions on if/when to stop taking ASA prior to surgery. Pt understood instructions.   ERAS Protcol - Clear liquids until 0530 morning of surgery PRE-SURGERY Ensure or G2- n/a  COVID TEST- n/a   Anesthesia review: No.   Patient denies shortness of breath, fever, cough and chest pain at PAT appointment   All instructions explained to the patient, with a verbal understanding of the material. Patient agrees to go over the instructions while at home for a better understanding. Patient also instructed to self quarantine after being tested for COVID-19. The opportunity to ask questions was provided.

## 2022-03-04 ENCOUNTER — Ambulatory Visit (HOSPITAL_COMMUNITY): Admission: RE | Admit: 2022-03-04 | Payer: 59 | Source: Ambulatory Visit | Admitting: Neurosurgery

## 2022-03-04 ENCOUNTER — Encounter (HOSPITAL_COMMUNITY): Admission: RE | Payer: Self-pay | Source: Ambulatory Visit

## 2022-03-04 DIAGNOSIS — M62552 Muscle wasting and atrophy, not elsewhere classified, left thigh: Secondary | ICD-10-CM | POA: Diagnosis not present

## 2022-03-04 DIAGNOSIS — M2569 Stiffness of other specified joint, not elsewhere classified: Secondary | ICD-10-CM | POA: Diagnosis not present

## 2022-03-04 DIAGNOSIS — M4316 Spondylolisthesis, lumbar region: Secondary | ICD-10-CM | POA: Diagnosis not present

## 2022-03-04 DIAGNOSIS — R269 Unspecified abnormalities of gait and mobility: Secondary | ICD-10-CM | POA: Diagnosis not present

## 2022-03-04 SURGERY — POSTERIOR LUMBAR FUSION 1 LEVEL
Anesthesia: General

## 2022-03-06 DIAGNOSIS — R269 Unspecified abnormalities of gait and mobility: Secondary | ICD-10-CM | POA: Diagnosis not present

## 2022-03-06 DIAGNOSIS — M62552 Muscle wasting and atrophy, not elsewhere classified, left thigh: Secondary | ICD-10-CM | POA: Diagnosis not present

## 2022-03-06 DIAGNOSIS — M2569 Stiffness of other specified joint, not elsewhere classified: Secondary | ICD-10-CM | POA: Diagnosis not present

## 2022-03-06 DIAGNOSIS — M4316 Spondylolisthesis, lumbar region: Secondary | ICD-10-CM | POA: Diagnosis not present

## 2022-03-09 DIAGNOSIS — M62552 Muscle wasting and atrophy, not elsewhere classified, left thigh: Secondary | ICD-10-CM | POA: Diagnosis not present

## 2022-03-09 DIAGNOSIS — M2569 Stiffness of other specified joint, not elsewhere classified: Secondary | ICD-10-CM | POA: Diagnosis not present

## 2022-03-09 DIAGNOSIS — M4316 Spondylolisthesis, lumbar region: Secondary | ICD-10-CM | POA: Diagnosis not present

## 2022-03-09 DIAGNOSIS — R269 Unspecified abnormalities of gait and mobility: Secondary | ICD-10-CM | POA: Diagnosis not present

## 2022-03-11 DIAGNOSIS — M62552 Muscle wasting and atrophy, not elsewhere classified, left thigh: Secondary | ICD-10-CM | POA: Diagnosis not present

## 2022-03-11 DIAGNOSIS — M2569 Stiffness of other specified joint, not elsewhere classified: Secondary | ICD-10-CM | POA: Diagnosis not present

## 2022-03-11 DIAGNOSIS — R269 Unspecified abnormalities of gait and mobility: Secondary | ICD-10-CM | POA: Diagnosis not present

## 2022-03-11 DIAGNOSIS — M4316 Spondylolisthesis, lumbar region: Secondary | ICD-10-CM | POA: Diagnosis not present

## 2022-03-16 DIAGNOSIS — M4316 Spondylolisthesis, lumbar region: Secondary | ICD-10-CM | POA: Diagnosis not present

## 2022-03-16 DIAGNOSIS — R269 Unspecified abnormalities of gait and mobility: Secondary | ICD-10-CM | POA: Diagnosis not present

## 2022-03-16 DIAGNOSIS — M62552 Muscle wasting and atrophy, not elsewhere classified, left thigh: Secondary | ICD-10-CM | POA: Diagnosis not present

## 2022-03-16 DIAGNOSIS — M2569 Stiffness of other specified joint, not elsewhere classified: Secondary | ICD-10-CM | POA: Diagnosis not present

## 2022-03-23 DIAGNOSIS — M62552 Muscle wasting and atrophy, not elsewhere classified, left thigh: Secondary | ICD-10-CM | POA: Diagnosis not present

## 2022-03-23 DIAGNOSIS — M4316 Spondylolisthesis, lumbar region: Secondary | ICD-10-CM | POA: Diagnosis not present

## 2022-03-23 DIAGNOSIS — R269 Unspecified abnormalities of gait and mobility: Secondary | ICD-10-CM | POA: Diagnosis not present

## 2022-03-23 DIAGNOSIS — M2569 Stiffness of other specified joint, not elsewhere classified: Secondary | ICD-10-CM | POA: Diagnosis not present

## 2022-04-01 ENCOUNTER — Other Ambulatory Visit: Payer: Self-pay | Admitting: Neurosurgery

## 2022-04-09 DIAGNOSIS — M48062 Spinal stenosis, lumbar region with neurogenic claudication: Secondary | ICD-10-CM | POA: Diagnosis not present

## 2022-04-09 DIAGNOSIS — M4316 Spondylolisthesis, lumbar region: Secondary | ICD-10-CM | POA: Diagnosis not present

## 2022-04-09 DIAGNOSIS — Z6837 Body mass index (BMI) 37.0-37.9, adult: Secondary | ICD-10-CM | POA: Diagnosis not present

## 2022-04-10 ENCOUNTER — Encounter (HOSPITAL_COMMUNITY): Payer: Self-pay | Admitting: Neurosurgery

## 2022-04-10 ENCOUNTER — Other Ambulatory Visit: Payer: Self-pay

## 2022-04-10 NOTE — Progress Notes (Signed)
PCP - Nita Sells, MD Cardiologist -  Pt saw Dr. Abelardo Diesel in 2016 for CP related to Metformin, once the patient was off the medication his symptoms went away. Did not need to see MD once symptoms resolved.    PPM/ICD - denies  Chest x-ray - n/a EKG - 02/23/22  CPAP - positive for OSA - patient is not wearing CPAP  Fasting Blood Sugar - n/a  Blood Thinner Instructions: n/a Aspirin Instructions: Patient was instructed: As of today, STOP taking any Aspirin (unless otherwise instructed by your surgeon) Aleve, Naproxen, Ibuprofen, Motrin, Advil, Goody's, BC's, all herbal medications, fish oil, and all vitamins.  ERAS Protcol - yes, until 09:30 o'clock  COVID TEST- n/a  Anesthesia review: no  Patient verbally denies any shortness of breath, fever, cough and chest pain during phone call   -------------  SDW INSTRUCTIONS given:  Your procedure is scheduled on Wednesday, December 27th, 2023.  Report to [facility] Main Entrance "A" at 10:00 A.M., and check in at the Admitting office.  Call this number if you have problems the morning of surgery:  [phone]   Remember:  Do not eat after midnight the night before your surgery  You may drink clear liquids until 09:30 the morning of your surgery.   Clear liquids allowed are: Water, Non-Citrus Juices (without pulp), Carbonated Beverages, Clear Tea, Black Coffee Only, and Gatorade    Take these medicines the morning of surgery with A SIP OF WATER: Fenofibrate PRN: Flexeril, eye drops   The day of surgery:                     Do not wear jewelry,             Do not wear lotions, powders, perfumes, or deodorant.            Do not shave 48 hours prior to surgery.              Do not bring valuables to the hospital.            [facility] is not responsible for any belongings or valuables.  Do NOT Smoke (Tobacco/Vaping) 24 hours prior to your procedure If you use a CPAP at night, you may bring all equipment for your overnight  stay.   Contacts, glasses, dentures or bridgework may not be worn into surgery.      For patients admitted to the hospital, discharge time will be determined by your treatment team.   Patients discharged the day of surgery will not be allowed to drive home, and someone needs to stay with them for 24 hours.    Special instructions:   [facility]- Preparing For Surgery  Before surgery, you can play an important role. Because skin is not sterile, your skin needs to be as free of germs as possible. You can reduce the number of germs on your skin by washing with CHG (chlorahexidine gluconate) Soap before surgery.  CHG is an antiseptic cleaner which kills germs and bonds with the skin to continue killing germs even after washing.    Oral Hygiene is also important to reduce your risk of infection.  Remember - BRUSH YOUR TEETH THE MORNING OF SURGERY WITH YOUR REGULAR TOOTHPASTE  Please do not use if you have an allergy to CHG or antibacterial soaps. If your skin becomes reddened/irritated stop using the CHG.  Do not shave (including legs and underarms) for at least 48 hours prior to first CHG  shower. It is OK to shave your face.  Please follow these instructions carefully.   Shower the NIGHT BEFORE SURGERY and the MORNING OF SURGERY with DIAL Soap.   Pat yourself dry with a CLEAN TOWEL.  Wear CLEAN PAJAMAS to bed the night before surgery  Place CLEAN SHEETS on your bed the night of your first shower and DO NOT SLEEP WITH PETS.   Day of Surgery: Please shower morning of surgery  Wear Clean/Comfortable clothing the morning of surgery Do not apply any deodorants/lotions.   Remember to brush your teeth WITH YOUR REGULAR TOOTHPASTE.   Questions were answered. Patient verbalized understanding of instructions.

## 2022-04-11 IMAGING — MR MR LUMBAR SPINE W/O CM
4 of 5 series · 30 of 48 positions shown · non-contrast
Comparison: Lumbar spine radiographs 04/10/2021

CLINICAL DATA: Lumbago with sciatica on the left for 6 months

EXAM:
MRI LUMBAR SPINE WITHOUT CONTRAST
TECHNIQUE: Multiplanar, multisequence MR imaging of the lumbar spine was
performed. No intravenous contrast was administered.

[Series 5: T2 · sagittal · 4.0mm · 0.68mm/px · 6 of 15 slices shown (1 of 2)]
[im 1/15]
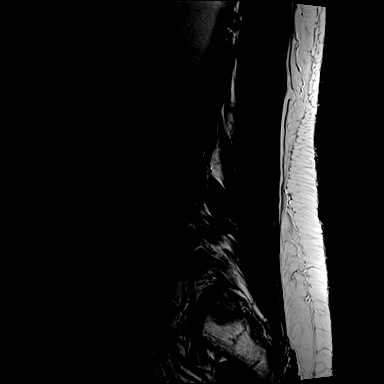
[im 3/15]
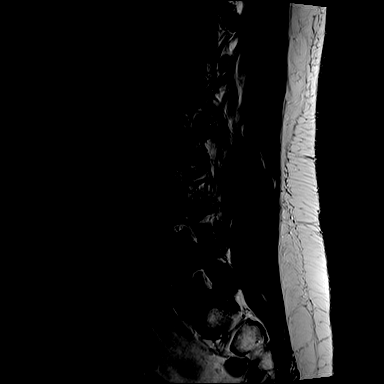
[im 6/15]
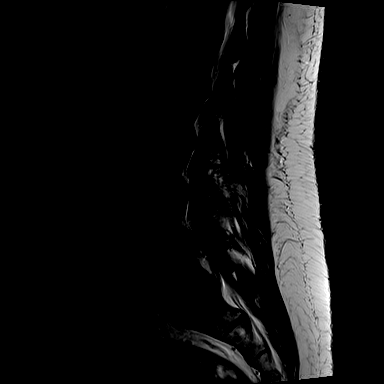
[im 9/15]
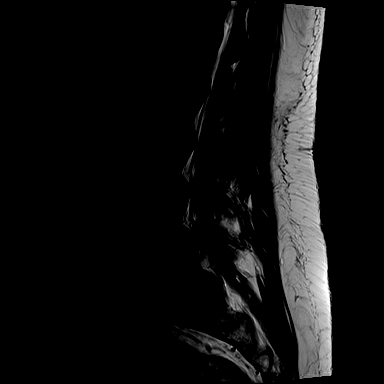
[im 12/15]
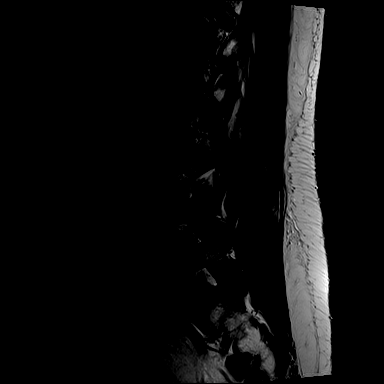
[im 15/15]
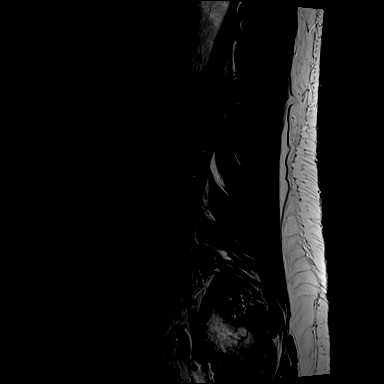

[Series 6: T1 · sagittal · 4.0mm · 0.81mm/px · 6 of 15 slices shown (1 of 2)]
[im 1/15]
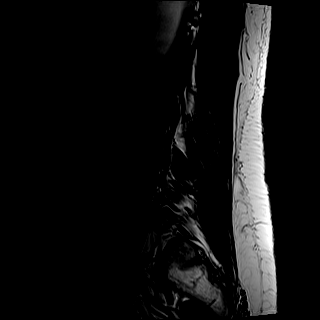
[im 3/15]
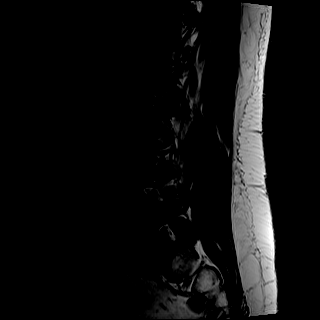
[im 6/15]
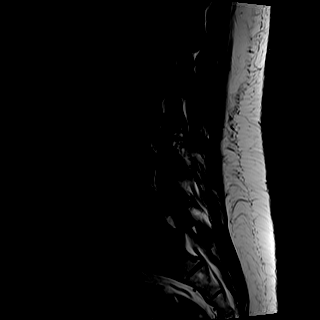
[im 9/15]
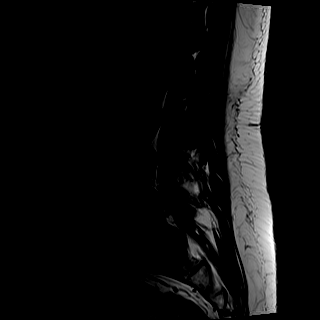
[im 12/15]
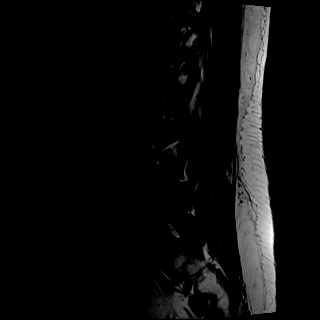
[im 15/15]
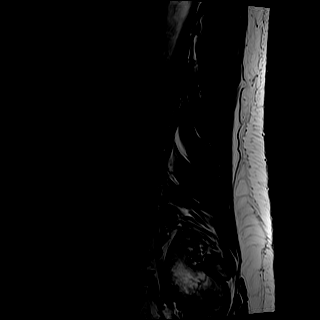

[Series 8: T2 · axial · 4.0mm · 0.70mm/px · z∈[-52,+194]mm · 9 of 41 slices shown (2 of 2)]
[im 1/41]
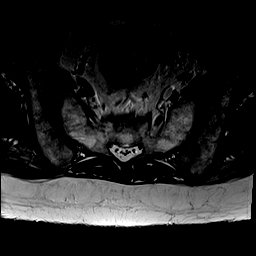
[im 6/41]
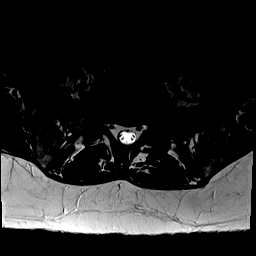
[im 12/41]
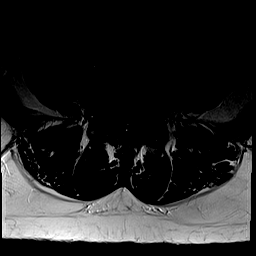
[im 18/41]
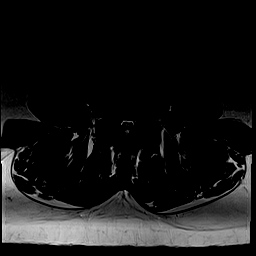
[im 21/41]
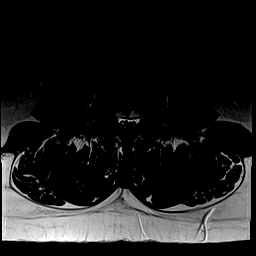
[im 23/41]
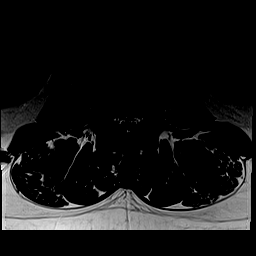
[im 29/41]
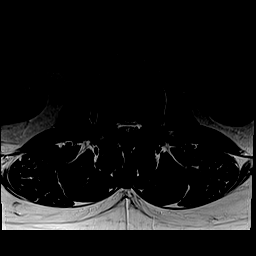
[im 35/41]
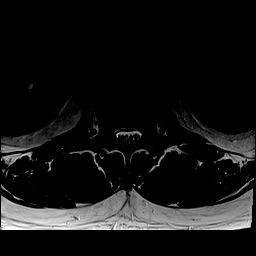
[im 41/41]
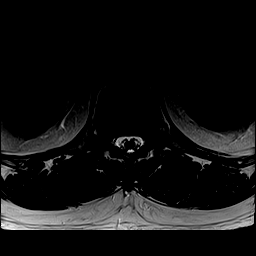

[Series 9: T1 · axial · 4.0mm · 0.35mm/px · z∈[-52,+194]mm · 9 of 41 slices shown (2 of 2)]
[im 1/41]
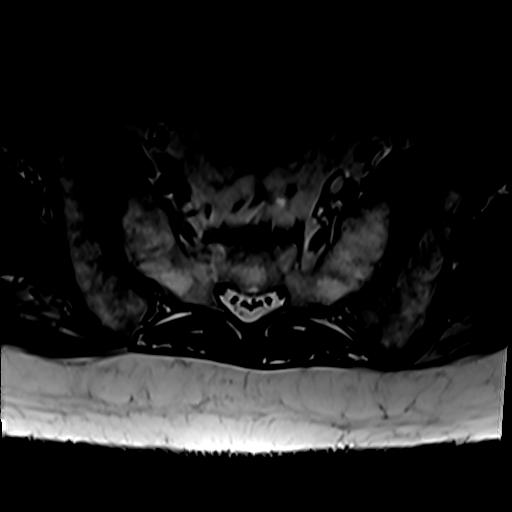
[im 6/41]
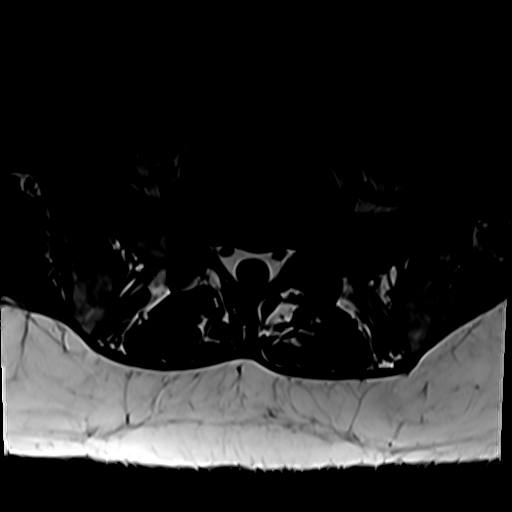
[im 12/41]
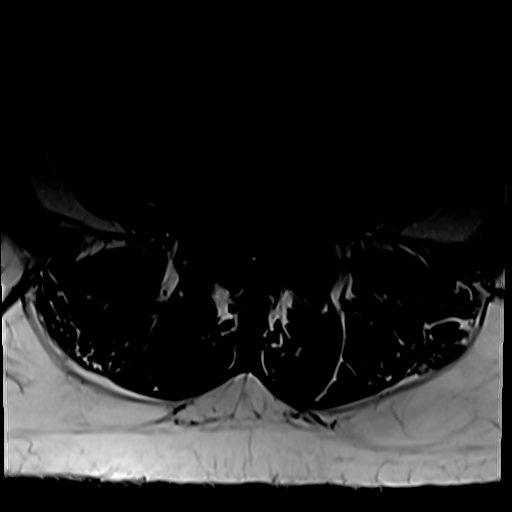
[im 18/41]
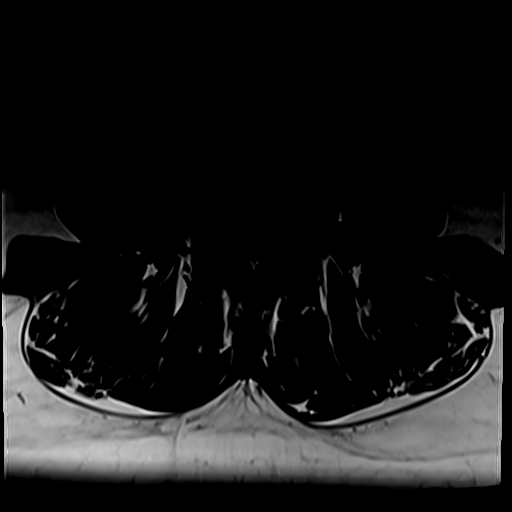
[im 21/41]
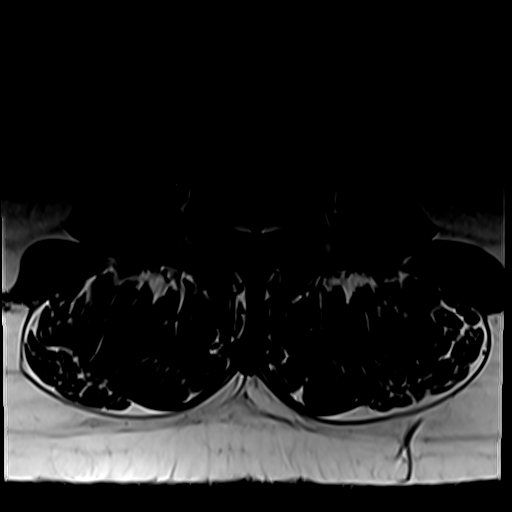
[im 23/41]
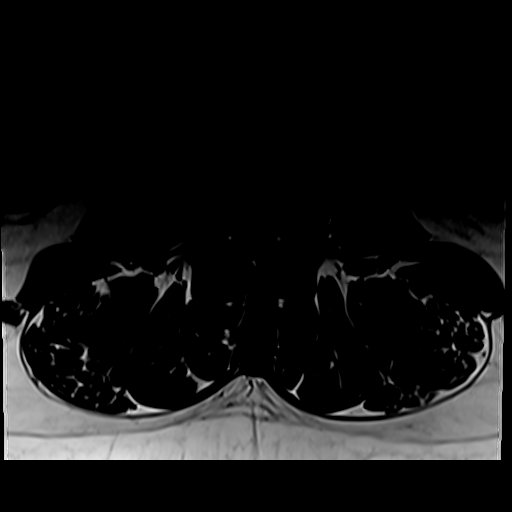
[im 29/41]
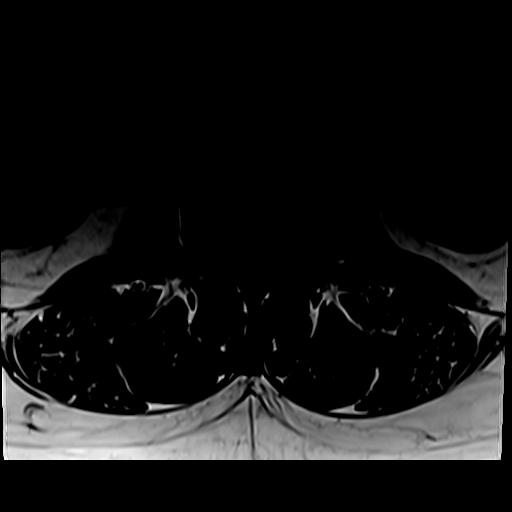
[im 35/41]
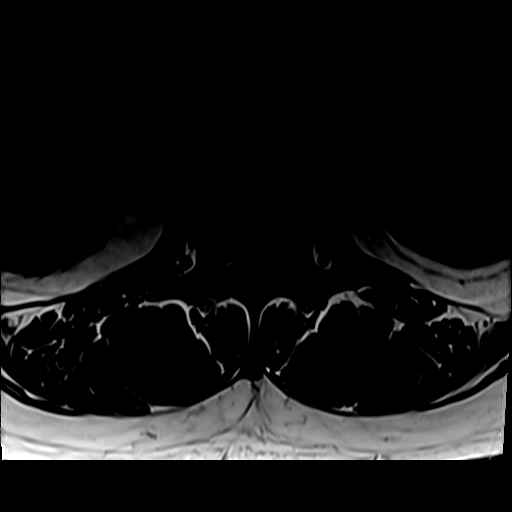
[im 41/41]
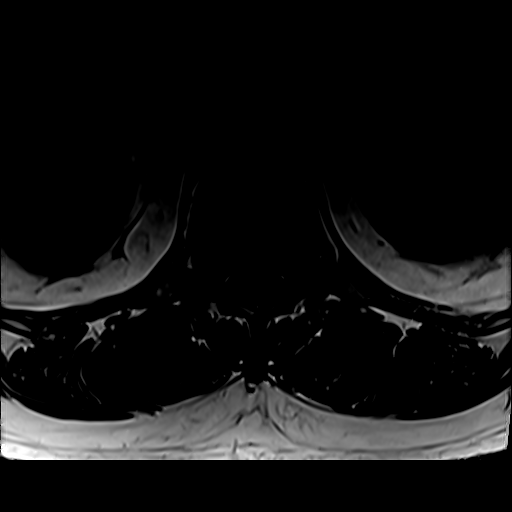

[30 of 48 positions shown; findings below may reference images not displayed]

FINDINGS: Segmentation: Standard; the lowest formed disc space is designated
L5-S1.

Alignment: There is trace grade 1 anterolisthesis of L4 on L5.
Alignment is otherwise normal.

Vertebrae: Vertebral body heights are preserved. Small foci of T1
hyperintensity in the L1 vertebral body likely reflect small
hemangiomas. There is no suspicious marrow signal abnormality. There
is mild edema in the posterior elements on the left at L4 and
bilaterally at L5, likely reactive/degenerative.

Conus medullaris and cauda equina: Conus extends to the L1 level.
Conus and cauda equina appear normal.

Paraspinal and other soft tissues: There is a subcentimeter right
renal cyst. There is mild perifacetal soft tissue edema bilaterally
at L4-L5.

Disc levels:

The intervertebral disc heights are overall preserved. There is
multilevel facet arthropathy, most advanced at L4-L5.

There is marked congenital stenosis of the lumbar canal.

T12-L1: No significant spinal canal or neural foraminal stenosis.

L1-L2: There is a mild disc bulge and mild bilateral facet
arthropathy without significant spinal canal stenosis. There is
moderate spinal canal stenosis

L2-L3: There is a mild disc bulge, ligamentum flavum thickening, and
bilateral facet arthropathy superimposed on congenital canal
narrowing resulting in moderate to severe spinal canal stenosis
without significant neural foraminal stenosis.

L3-L4: There is a diffuse disc bulge, ligamentum flavum thickening,
and bilateral facet arthropathy superimposed on congenital canal
narrowing resulting in moderate spinal canal stenosis and
mild-to-moderate right and moderate left neural foraminal stenosis.

L4-L5: There is grade 1 anterolisthesis with uncovering of the disc
posteriorly with a superimposed bulge, ligamentum flavum thickening,
and moderate left worse than right facet arthropathy superimposed on
congenital canal narrowing resulting in severe spinal canal stenosis
with compression of the cauda equina nerve roots and moderate left
and mild right neural foraminal stenosis

L5-S1: There is a mild disc bulge and bilateral facet arthropathy
resulting in mild narrowing of the left subarticular zone without
evidence of nerve root impingement and mild bilateral neural
foraminal stenosis.
IMPRESSION: 1. Marked congenital narrowing of the lumbar spinal canal with
superimposed degenerative changes detailed above resulting in
moderate to severe spinal canal stenosis at L2-L3 moderate spinal
canal stenosis at L3-L4, and severe spinal canal stenosis at L4-L5
with impingement of the cauda equina nerve roots at this level.
2. Mild-to-moderate right and moderate left neural foraminal
stenosis at L3-L4, and moderate left and mild right neural foraminal
stenosis at L4-L5.
3. Multilevel facet arthropathy, most advanced at L4-L5 with
reactive marrow edema in the posterior elements on the right at L4
and bilaterally at L5. There is also mild perifacetal soft tissue
edema, which could reflect a source of pain.

## 2022-04-15 ENCOUNTER — Observation Stay (HOSPITAL_COMMUNITY)
Admission: RE | Admit: 2022-04-15 | Discharge: 2022-04-16 | Disposition: A | Discharge status: Home / Self Care | Payer: 59 | Attending: Neurosurgery | Admitting: Neurosurgery

## 2022-04-15 ENCOUNTER — Encounter (HOSPITAL_COMMUNITY)
Admission: RE | Disposition: A | Discharge status: Home / Self Care | Payer: Self-pay | Source: Home / Self Care | Attending: Neurosurgery

## 2022-04-15 ENCOUNTER — Ambulatory Visit (HOSPITAL_COMMUNITY): Payer: 59 | Admitting: Certified Registered Nurse Anesthetist

## 2022-04-15 ENCOUNTER — Ambulatory Visit (HOSPITAL_BASED_OUTPATIENT_CLINIC_OR_DEPARTMENT_OTHER): Payer: 59 | Admitting: Certified Registered Nurse Anesthetist

## 2022-04-15 ENCOUNTER — Encounter (HOSPITAL_COMMUNITY): Payer: Self-pay | Admitting: Neurosurgery

## 2022-04-15 ENCOUNTER — Ambulatory Visit (HOSPITAL_COMMUNITY): Payer: 59

## 2022-04-15 ENCOUNTER — Other Ambulatory Visit: Payer: Self-pay

## 2022-04-15 DIAGNOSIS — M48061 Spinal stenosis, lumbar region without neurogenic claudication: Secondary | ICD-10-CM | POA: Insufficient documentation

## 2022-04-15 DIAGNOSIS — M48062 Spinal stenosis, lumbar region with neurogenic claudication: Secondary | ICD-10-CM | POA: Diagnosis not present

## 2022-04-15 DIAGNOSIS — I10 Essential (primary) hypertension: Secondary | ICD-10-CM | POA: Diagnosis not present

## 2022-04-15 DIAGNOSIS — Z981 Arthrodesis status: Secondary | ICD-10-CM | POA: Diagnosis not present

## 2022-04-15 DIAGNOSIS — M4316 Spondylolisthesis, lumbar region: Principal | ICD-10-CM | POA: Diagnosis present

## 2022-04-15 HISTORY — DX: Sleep apnea, unspecified: G47.30

## 2022-04-15 HISTORY — PX: BACK SURGERY: SHX140

## 2022-04-15 LAB — CBC
HCT: 45.8 % (ref 39.0–52.0)
Hemoglobin: 15.4 g/dL (ref 13.0–17.0)
MCH: 30.9 pg (ref 26.0–34.0)
MCHC: 33.6 g/dL (ref 30.0–36.0)
MCV: 92 fL (ref 80.0–100.0)
Platelets: 278 10*3/uL (ref 150–400)
RBC: 4.98 MIL/uL (ref 4.22–5.81)
RDW: 12.7 % (ref 11.5–15.5)
WBC: 5.5 10*3/uL (ref 4.0–10.5)
nRBC: 0 % (ref 0.0–0.2)

## 2022-04-15 LAB — SURGICAL PCR SCREEN
MRSA, PCR: NEGATIVE
Staphylococcus aureus: NEGATIVE

## 2022-04-15 LAB — BASIC METABOLIC PANEL
Anion gap: 7 (ref 5–15)
BUN: 15 mg/dL (ref 6–20)
CO2: 24 mmol/L (ref 22–32)
Calcium: 9.1 mg/dL (ref 8.9–10.3)
Chloride: 107 mmol/L (ref 98–111)
Creatinine, Ser: 0.84 mg/dL (ref 0.61–1.24)
GFR, Estimated: >60 mL/min (ref >60)
Glucose, Bld: 108 mg/dL — ABNORMAL HIGH (ref 70–99)
Potassium: 3.8 mmol/L (ref 3.5–5.1)
Sodium: 138 mmol/L (ref 135–145)

## 2022-04-15 LAB — TYPE AND SCREEN
ABO/RH(D): B POS
Antibody Screen: NEGATIVE

## 2022-04-15 SURGERY — POSTERIOR LUMBAR FUSION 1 LEVEL
Anesthesia: General

## 2022-04-15 MED ORDER — PROPOFOL 10 MG/ML IV BOLUS
INTRAVENOUS | Status: AC
  Filled 2022-04-15: qty 20

## 2022-04-15 MED ORDER — LISINOPRIL 20 MG PO TABS
20.0000 mg | ORAL_TABLET | Freq: Every day | ORAL | Status: DC
  Administered 2022-04-15 – 2022-04-16 (×2): 20 mg via ORAL
  Filled 2022-04-15 (×2): qty 1

## 2022-04-15 MED ORDER — 0.9 % SODIUM CHLORIDE (POUR BTL) OPTIME
TOPICAL | Status: DC | PRN
  Administered 2022-04-15: 1000 mL

## 2022-04-15 MED ORDER — ROCURONIUM BROMIDE 10 MG/ML (PF) SYRINGE
PREFILLED_SYRINGE | INTRAVENOUS | Status: AC
  Filled 2022-04-15: qty 20

## 2022-04-15 MED ORDER — ORAL CARE MOUTH RINSE: 15.0000 mL | Freq: Once | OROMUCOSAL | Status: DC

## 2022-04-15 MED ORDER — AMISULPRIDE (ANTIEMETIC) 5 MG/2ML IV SOLN: 10.0000 mg | Freq: Once | INTRAVENOUS | Status: DC | PRN

## 2022-04-15 MED ORDER — SUGAMMADEX SODIUM 200 MG/2ML IV SOLN: INTRAVENOUS | Status: DC | PRN

## 2022-04-15 MED ORDER — LACTATED RINGERS IV SOLN: INTRAVENOUS | Status: DC

## 2022-04-15 MED ORDER — HYDROMORPHONE HCL 1 MG/ML IJ SOLN: 1.0000 mg | INTRAMUSCULAR | Status: DC | PRN

## 2022-04-15 MED ORDER — ONDANSETRON HCL 4 MG/2ML IJ SOLN
INTRAMUSCULAR | Status: AC
  Filled 2022-04-15: qty 2

## 2022-04-15 MED ORDER — LISINOPRIL-HYDROCHLOROTHIAZIDE 20-12.5 MG PO TABS: 1.0000 | ORAL_TABLET | Freq: Every morning | ORAL | Status: DC

## 2022-04-15 MED ORDER — FENTANYL CITRATE (PF) 250 MCG/5ML IJ SOLN
INTRAMUSCULAR | Status: DC | PRN
  Administered 2022-04-15 (×2): 100 ug via INTRAVENOUS
  Administered 2022-04-15: 50 ug via INTRAVENOUS

## 2022-04-15 MED ORDER — ONDANSETRON HCL 4 MG/2ML IJ SOLN
INTRAMUSCULAR | Status: DC | PRN
  Administered 2022-04-15: 4 mg via INTRAVENOUS

## 2022-04-15 MED ORDER — THROMBIN 20000 UNITS EX SOLR
CUTANEOUS | Status: AC
  Filled 2022-04-15: qty 20000

## 2022-04-15 MED ORDER — MIDAZOLAM HCL 5 MG/5ML IJ SOLN
INTRAMUSCULAR | Status: DC | PRN
  Administered 2022-04-15: 2 mg via INTRAVENOUS

## 2022-04-15 MED ORDER — SODIUM CHLORIDE 0.9% FLUSH: 3.0000 mL | INTRAVENOUS | Status: DC | PRN

## 2022-04-15 MED ORDER — DOCUSATE SODIUM 100 MG PO CAPS
100.0000 mg | ORAL_CAPSULE | Freq: Two times a day (BID) | ORAL | Status: DC
  Administered 2022-04-15 – 2022-04-16 (×2): 100 mg via ORAL
  Filled 2022-04-15 (×2): qty 1

## 2022-04-15 MED ORDER — PHENYLEPHRINE 80 MCG/ML (10ML) SYRINGE FOR IV PUSH (FOR BLOOD PRESSURE SUPPORT)
PREFILLED_SYRINGE | INTRAVENOUS | Status: AC
  Filled 2022-04-15: qty 10

## 2022-04-15 MED ORDER — LIDOCAINE 2% (20 MG/ML) 5 ML SYRINGE
INTRAMUSCULAR | Status: AC
  Filled 2022-04-15: qty 5

## 2022-04-15 MED ORDER — OXYCODONE HCL 5 MG PO TABS: 5.0000 mg | ORAL_TABLET | ORAL | Status: DC | PRN

## 2022-04-15 MED ORDER — DEXAMETHASONE SODIUM PHOSPHATE 10 MG/ML IJ SOLN
INTRAMUSCULAR | Status: AC
  Filled 2022-04-15: qty 1

## 2022-04-15 MED ORDER — LIDOCAINE-EPINEPHRINE 0.5 %-1:200000 IJ SOLN
INTRAMUSCULAR | Status: AC
  Filled 2022-04-15: qty 1

## 2022-04-15 MED ORDER — CHLORHEXIDINE GLUCONATE 0.12 % MT SOLN: 15.0000 mL | Freq: Once | OROMUCOSAL | Status: DC

## 2022-04-15 MED ORDER — DIAZEPAM 5 MG PO TABS: 5.0000 mg | ORAL_TABLET | Freq: Four times a day (QID) | ORAL | Status: DC | PRN

## 2022-04-15 MED ORDER — ASPIRIN 81 MG PO TBEC
81.0000 mg | DELAYED_RELEASE_TABLET | Freq: Every morning | ORAL | Status: DC
  Administered 2022-04-16: 81 mg via ORAL
  Filled 2022-04-15: qty 1

## 2022-04-15 MED ORDER — THROMBIN 20000 UNITS EX SOLR
CUTANEOUS | Status: DC | PRN
  Administered 2022-04-15: 20 mL via TOPICAL

## 2022-04-15 MED ORDER — ZOLPIDEM TARTRATE 5 MG PO TABS: 5.0000 mg | ORAL_TABLET | Freq: Every evening | ORAL | Status: DC | PRN

## 2022-04-15 MED ORDER — PHENOL 1.4 % MT LIQD: 1.0000 | OROMUCOSAL | Status: DC | PRN

## 2022-04-15 MED ORDER — SENNOSIDES-DOCUSATE SODIUM 8.6-50 MG PO TABS: 1.0000 | ORAL_TABLET | Freq: Every evening | ORAL | Status: DC | PRN

## 2022-04-15 MED ORDER — ONDANSETRON HCL 4 MG/2ML IJ SOLN: 4.0000 mg | Freq: Four times a day (QID) | INTRAMUSCULAR | Status: DC | PRN

## 2022-04-15 MED ORDER — SODIUM CHLORIDE 0.9% FLUSH: 3.0000 mL | Freq: Two times a day (BID) | INTRAVENOUS | Status: DC

## 2022-04-15 MED ORDER — FENOFIBRATE 160 MG PO TABS
160.0000 mg | ORAL_TABLET | Freq: Every morning | ORAL | Status: DC
  Administered 2022-04-16: 160 mg via ORAL
  Filled 2022-04-15 (×2): qty 1

## 2022-04-15 MED ORDER — MIDAZOLAM HCL 2 MG/2ML IJ SOLN
INTRAMUSCULAR | Status: AC
  Filled 2022-04-15: qty 2

## 2022-04-15 MED ORDER — FENTANYL CITRATE (PF) 100 MCG/2ML IJ SOLN: 25.0000 ug | INTRAMUSCULAR | Status: DC | PRN

## 2022-04-15 MED ORDER — OXYCODONE HCL 5 MG PO TABS: 5.0000 mg | ORAL_TABLET | Freq: Once | ORAL | Status: DC | PRN

## 2022-04-15 MED ORDER — HYDROMORPHONE HCL 1 MG/ML IJ SOLN
INTRAMUSCULAR | Status: AC
  Filled 2022-04-15: qty 0.5

## 2022-04-15 MED ORDER — OXYCODONE HCL ER 10 MG PO T12A
10.0000 mg | EXTENDED_RELEASE_TABLET | Freq: Two times a day (BID) | ORAL | Status: DC
  Administered 2022-04-15 – 2022-04-16 (×2): 10 mg via ORAL
  Filled 2022-04-15 (×2): qty 1

## 2022-04-15 MED ORDER — ACETAMINOPHEN 650 MG RE SUPP: 650.0000 mg | RECTAL | Status: DC | PRN

## 2022-04-15 MED ORDER — OXYCODONE HCL 5 MG PO TABS
10.0000 mg | ORAL_TABLET | ORAL | Status: DC | PRN
  Administered 2022-04-15 – 2022-04-16 (×4): 10 mg via ORAL
  Filled 2022-04-15 (×4): qty 2

## 2022-04-15 MED ORDER — ORAL CARE MOUTH RINSE: 15.0000 mL | Freq: Once | OROMUCOSAL | Status: AC

## 2022-04-15 MED ORDER — POTASSIUM CHLORIDE IN NACL 20-0.9 MEQ/L-% IV SOLN
INTRAVENOUS | Status: DC
  Filled 2022-04-15: qty 1000

## 2022-04-15 MED ORDER — BUPIVACAINE HCL (PF) 0.5 % IJ SOLN
INTRAMUSCULAR | Status: DC | PRN
  Administered 2022-04-15: 29 mL

## 2022-04-15 MED ORDER — FENTANYL CITRATE (PF) 250 MCG/5ML IJ SOLN
INTRAMUSCULAR | Status: AC
  Filled 2022-04-15: qty 5

## 2022-04-15 MED ORDER — SYSTANE CONTACTS OP SOLN: 1.0000 | OPHTHALMIC | Status: DC | PRN

## 2022-04-15 MED ORDER — SODIUM CHLORIDE 0.9 % IV SOLN: 250.0000 mL | INTRAVENOUS | Status: DC

## 2022-04-15 MED ORDER — ZINC 50 MG PO TABS: ORAL_TABLET | Freq: Every day | ORAL | Status: DC | PRN

## 2022-04-15 MED ORDER — BISACODYL 5 MG PO TBEC: 5.0000 mg | DELAYED_RELEASE_TABLET | Freq: Every day | ORAL | Status: DC | PRN

## 2022-04-15 MED ORDER — OXYCODONE HCL 5 MG/5ML PO SOLN: 5.0000 mg | Freq: Once | ORAL | Status: DC | PRN

## 2022-04-15 MED ORDER — PROPOFOL 10 MG/ML IV BOLUS
INTRAVENOUS | Status: DC | PRN
  Administered 2022-04-15: 150 mg via INTRAVENOUS

## 2022-04-15 MED ORDER — BUPIVACAINE HCL (PF) 0.5 % IJ SOLN
INTRAMUSCULAR | Status: AC
  Filled 2022-04-15: qty 30

## 2022-04-15 MED ORDER — PHENYLEPHRINE 80 MCG/ML (10ML) SYRINGE FOR IV PUSH (FOR BLOOD PRESSURE SUPPORT)
PREFILLED_SYRINGE | INTRAVENOUS | Status: DC | PRN
  Administered 2022-04-15 (×2): 40 ug via INTRAVENOUS

## 2022-04-15 MED ORDER — MAGNESIUM CITRATE PO SOLN: 1.0000 | Freq: Once | ORAL | Status: DC | PRN

## 2022-04-15 MED ORDER — ACETAMINOPHEN 10 MG/ML IV SOLN: 1000.0000 mg | Freq: Once | INTRAVENOUS | Status: DC | PRN

## 2022-04-15 MED ORDER — PROMETHAZINE HCL 25 MG/ML IJ SOLN: 6.2500 mg | INTRAMUSCULAR | Status: DC | PRN

## 2022-04-15 MED ORDER — ACETAMINOPHEN 325 MG PO TABS: 650.0000 mg | ORAL_TABLET | ORAL | Status: DC | PRN

## 2022-04-15 MED ORDER — CHLORHEXIDINE GLUCONATE 0.12 % MT SOLN
15.0000 mL | Freq: Once | OROMUCOSAL | Status: AC
  Administered 2022-04-15: 15 mL via OROMUCOSAL
  Filled 2022-04-15: qty 15

## 2022-04-15 MED ORDER — MENTHOL 3 MG MT LOZG: 1.0000 | LOZENGE | OROMUCOSAL | Status: DC | PRN

## 2022-04-15 MED ORDER — HYDROCHLOROTHIAZIDE 12.5 MG PO TABS
12.5000 mg | ORAL_TABLET | Freq: Every day | ORAL | Status: DC
  Administered 2022-04-16: 12.5 mg via ORAL
  Filled 2022-04-15: qty 1

## 2022-04-15 MED ORDER — CHLORHEXIDINE GLUCONATE CLOTH 2 % EX PADS: 6.0000 | MEDICATED_PAD | Freq: Once | CUTANEOUS | Status: DC

## 2022-04-15 MED ORDER — LIDOCAINE-EPINEPHRINE 0.5 %-1:200000 IJ SOLN
INTRAMUSCULAR | Status: DC | PRN
  Administered 2022-04-15: 10 mL

## 2022-04-15 MED ORDER — ROCURONIUM BROMIDE 10 MG/ML (PF) SYRINGE
PREFILLED_SYRINGE | INTRAVENOUS | Status: DC | PRN
  Administered 2022-04-15: 30 mg via INTRAVENOUS
  Administered 2022-04-15: 60 mg via INTRAVENOUS
  Administered 2022-04-15: 30 mg via INTRAVENOUS

## 2022-04-15 MED ORDER — CEFAZOLIN SODIUM-DEXTROSE 2-4 GM/100ML-% IV SOLN
2.0000 g | INTRAVENOUS | Status: AC
  Administered 2022-04-15: 2 g via INTRAVENOUS
  Filled 2022-04-15: qty 100

## 2022-04-15 MED ORDER — ACETAMINOPHEN 500 MG PO TABS
1000.0000 mg | ORAL_TABLET | Freq: Four times a day (QID) | ORAL | Status: AC
  Administered 2022-04-15 – 2022-04-16 (×4): 1000 mg via ORAL
  Filled 2022-04-15 (×4): qty 2

## 2022-04-15 MED ORDER — LIDOCAINE 2% (20 MG/ML) 5 ML SYRINGE
INTRAMUSCULAR | Status: DC | PRN
  Administered 2022-04-15: 100 mg via INTRAVENOUS

## 2022-04-15 MED ORDER — SUGAMMADEX SODIUM 200 MG/2ML IV SOLN
INTRAVENOUS | Status: DC | PRN
  Administered 2022-04-15: 250 mg via INTRAVENOUS

## 2022-04-15 MED ORDER — DEXAMETHASONE SODIUM PHOSPHATE 10 MG/ML IJ SOLN
INTRAMUSCULAR | Status: DC | PRN
  Administered 2022-04-15: 10 mg via INTRAVENOUS

## 2022-04-15 MED ORDER — HYDROMORPHONE HCL 1 MG/ML IJ SOLN
INTRAMUSCULAR | Status: DC | PRN
  Administered 2022-04-15: .5 mg via INTRAVENOUS

## 2022-04-15 MED ORDER — ONDANSETRON HCL 4 MG PO TABS: 4.0000 mg | ORAL_TABLET | Freq: Four times a day (QID) | ORAL | Status: DC | PRN

## 2022-04-15 SURGICAL SUPPLY — 66 items
ADH SKN CLS APL DERMABOND .7 (GAUZE/BANDAGES/DRESSINGS) ×1
APL SKNCLS STERI-STRIP NONHPOA (GAUZE/BANDAGES/DRESSINGS)
BAG COUNTER SPONGE SURGICOUNT (BAG) ×2 IMPLANT
BAG SPNG CNTER NS LX DISP (BAG) ×1
BASKET BONE COLLECTION (BASKET) ×2 IMPLANT
BENZOIN TINCTURE PRP APPL 2/3 (GAUZE/BANDAGES/DRESSINGS) IMPLANT
BIT DRILL PLIF MAS DISP 5.5MM (DRILL) IMPLANT
BLADE BONE MILL MEDIUM (MISCELLANEOUS) ×2 IMPLANT
BLADE CLIPPER SURG (BLADE) IMPLANT
BUR MATCHSTICK NEURO 3.0 LAGG (BURR) ×2 IMPLANT
BUR PRECISION FLUTE 5.0 (BURR) ×2 IMPLANT
CANISTER SUCT 3000ML PPV (MISCELLANEOUS) ×2 IMPLANT
CAP RELINE MOD TULIP RMM (Cap) IMPLANT
CNTNR URN SCR LID CUP LEK RST (MISCELLANEOUS) ×2 IMPLANT
CONT SPEC 4OZ STRL OR WHT (MISCELLANEOUS) ×1
COVER BACK TABLE 60X90IN (DRAPES) ×2 IMPLANT
DERMABOND ADVANCED .7 DNX12 (GAUZE/BANDAGES/DRESSINGS) ×2 IMPLANT
DRAPE C-ARM 42X72 X-RAY (DRAPES) ×4 IMPLANT
DRAPE C-ARMOR (DRAPES) IMPLANT
DRAPE LAPAROTOMY 100X72X124 (DRAPES) ×2 IMPLANT
DRAPE SURG 17X23 STRL (DRAPES) ×2 IMPLANT
DRILL PLIF MAS DISP 5.5MM (DRILL) ×1
DRSG OPSITE POSTOP 4X6 (GAUZE/BANDAGES/DRESSINGS) IMPLANT
DURAPREP 26ML APPLICATOR (WOUND CARE) ×2 IMPLANT
ELECT REM PT RETURN 9FT ADLT (ELECTROSURGICAL) ×1
ELECTRODE REM PT RTRN 9FT ADLT (ELECTROSURGICAL) ×2 IMPLANT
GAUZE 4X4 16PLY ~~LOC~~+RFID DBL (SPONGE) IMPLANT
GAUZE SPONGE 4X4 12PLY STRL (GAUZE/BANDAGES/DRESSINGS) IMPLANT
GLOVE EXAM NITRILE XL STR (GLOVE) IMPLANT
GLOVE SURG LTX SZ6.5 (GLOVE) ×4 IMPLANT
GOWN STRL REUS W/ TWL LRG LVL3 (GOWN DISPOSABLE) ×4 IMPLANT
GOWN STRL REUS W/ TWL XL LVL3 (GOWN DISPOSABLE) IMPLANT
GOWN STRL REUS W/TWL 2XL LVL3 (GOWN DISPOSABLE) IMPLANT
GOWN STRL REUS W/TWL LRG LVL3 (GOWN DISPOSABLE) ×2
GOWN STRL REUS W/TWL XL LVL3 (GOWN DISPOSABLE)
KIT BASIN OR (CUSTOM PROCEDURE TRAY) ×2 IMPLANT
KIT POSITION SURG JACKSON T1 (MISCELLANEOUS) ×2 IMPLANT
KIT TURNOVER KIT B (KITS) ×2 IMPLANT
MILL BONE PREP (MISCELLANEOUS) IMPLANT
NDL HYPO 25X1 1.5 SAFETY (NEEDLE) ×2 IMPLANT
NDL SPNL 18GX3.5 QUINCKE PK (NEEDLE) IMPLANT
NEEDLE HYPO 25X1 1.5 SAFETY (NEEDLE) ×1 IMPLANT
NEEDLE SPNL 18GX3.5 QUINCKE PK (NEEDLE) IMPLANT
NS IRRIG 1000ML POUR BTL (IV SOLUTION) ×2 IMPLANT
PACK LAMINECTOMY NEURO (CUSTOM PROCEDURE TRAY) ×2 IMPLANT
PAD ARMBOARD 7.5X6 YLW CONV (MISCELLANEOUS) ×4 IMPLANT
ROD RELINE COCR LORD 5X30 (Rod) IMPLANT
SCREW LOCK RSS 4.5/5.0MM (Screw) IMPLANT
SCREW SHANK RELINE MOD 6.5X35 (Screw) IMPLANT
SCREW SHANK RELINE MOD 7.5X35 (Screw) ×2 IMPLANT
SCREW SHANK RLINE MD 7.5X35 4S (Screw) IMPLANT
SLEEVE SURGEON STRL (DRAPES) IMPLANT
SPACER EXP PROLIFT 8X28X10 15D (Cage) IMPLANT
SPIKE FLUID TRANSFER (MISCELLANEOUS) ×2 IMPLANT
SPONGE SURGIFOAM ABS GEL 100 (HEMOSTASIS) ×2 IMPLANT
SPONGE T-LAP 4X18 ~~LOC~~+RFID (SPONGE) IMPLANT
STRIP CLOSURE SKIN 1/2X4 (GAUZE/BANDAGES/DRESSINGS) IMPLANT
SUT PROLENE 6 0 BV (SUTURE) IMPLANT
SUT VIC AB 0 CT1 18XCR BRD8 (SUTURE) ×2 IMPLANT
SUT VIC AB 0 CT1 8-18 (SUTURE) ×1
SUT VIC AB 2-0 CT1 18 (SUTURE) ×2 IMPLANT
SUT VIC AB 3-0 SH 8-18 (SUTURE) ×2 IMPLANT
TOWEL GREEN STERILE (TOWEL DISPOSABLE) ×2 IMPLANT
TOWEL GREEN STERILE FF (TOWEL DISPOSABLE) ×2 IMPLANT
TRAY FOLEY MTR SLVR 16FR STAT (SET/KITS/TRAYS/PACK) ×2 IMPLANT
WATER STERILE IRR 1000ML POUR (IV SOLUTION) ×2 IMPLANT

## 2022-04-15 NOTE — Transfer of Care (Signed)
Immediate Anesthesia Transfer of Care Note  Patient: Nicholas Newton  Procedure(s) Performed: Lumbar Four-Five Posterior Lumbar Interbody Fusion  Patient Location: PACU  Anesthesia Type:General  Level of Consciousness: awake, alert , and oriented  Airway & Oxygen Therapy: Patient Spontanous Breathing and Patient connected to nasal cannula oxygen  Post-op Assessment: Report given to RN and Post -op Vital signs reviewed and stable  Post vital signs: Reviewed and stable  Last Vitals:  Vitals Value Taken Time  BP 151/110 04/15/22 1728  Temp    Pulse 94 04/15/22 1730  Resp 16 04/15/22 1730  SpO2 97 % 04/15/22 1730  Vitals shown include unvalidated device data.  Last Pain:  Vitals:   04/15/22 1031  TempSrc:   PainSc: 2       Patients Stated Pain Goal: 0 (04/15/22 1031)  Complications: No notable events documented.

## 2022-04-15 NOTE — Progress Notes (Signed)
Attempted to call report, 5N unable to take report

## 2022-04-15 NOTE — Anesthesia Procedure Notes (Signed)
Procedure Name: Intubation Date/Time: 04/15/2022 1:14 PM  Performed by: Colin Benton, CRNAPre-anesthesia Checklist: Patient identified, Emergency Drugs available, Suction available and Patient being monitored Patient Re-evaluated:Patient Re-evaluated prior to induction Oxygen Delivery Method: Circle system utilized Preoxygenation: Pre-oxygenation with 100% oxygen Induction Type: IV induction Ventilation: Mask ventilation without difficulty and Oral airway inserted - appropriate to patient size Laryngoscope Size: Mac and 4 Grade View: Grade II Tube type: Oral Tube size: 7.5 mm Number of attempts: 1 Airway Equipment and Method: Stylet Placement Confirmation: ETT inserted through vocal cords under direct vision, positive ETCO2 and breath sounds checked- equal and bilateral Secured at: 22 cm Tube secured with: Tape Dental Injury: Teeth and Oropharynx as per pre-operative assessment

## 2022-04-15 NOTE — Op Note (Signed)
04/15/2022  6:01 PM  PATIENT:  Nicholas Newton  51 y.o. male With stenosis and spondylolisthesis at L4/5. I have recommended operative decompression and he has agreed.  PRE-OPERATIVE DIAGNOSIS:  Spondylolisthesis, Lumbar region L4/5 Neurogenic claudication due to spinal stenosis L4/5  POST-OPERATIVE DIAGNOSIS:  Spondylolisthesis, Lumbar region L4/5 Spinal stenosis with neurogenic claudication L4/5  PROCEDURE:  Procedure(s): Lumbar Four-Five Posterior Lumbar Interbody Fusion, expandable stryker cages 10-15 mm range  filled with auto graft morsels Non segmental pedicle screw fixation L4/5 Nuvasive mas plif screws  SURGEON:  Surgeon(s): Coletta Memos, MD  ASSISTANTS:none  ANESTHESIA:   general  EBL:  Total I/O In: 100 [IV Piggyback:100] Out: 500 [Urine:300; Blood:200]  BLOOD ADMINISTERED:none  CELL SAVER GIVEN:not used  COUNT:per nursing  DRAINS: none   SPECIMEN:  No Specimen  DICTATION: Nicholas Newton is a 51 y.o. male whom was taken to the operating room intubated, and placed under a general anesthetic without difficulty. A foley catheter was placed under sterile conditions. He was positioned prone on a Jackson table with all pressure points properly padded.  His lumbar region was prepped and draped in a sterile manner. I infiltrated 20 cc's 1/2%lidocaine/1:2000,000 strength epinephrine into the planned incision. I opened the skin with a 10 blade and took the incision down to the thoracolumbar fascia. I exposed the lamina of L3,4,and 5 in a subperiosteal fashion bilaterally. I confirmed my location with an intraoperative xray.  I placed self retaining retractors and started the decompression.  I decompressed the spinal canal via semihemilaminectomies of L4 on the right and left sides. I used the drill and Kerrison punches to remove the bone and ligamentum flavum thereby decompressing the spinal canal and thecal sac. The L4 and L5 roots were decompressed by performing inferior  facetectomies of L4 bilaterally. Partial superior facetectomies of L5 were also performed to decompress the L5 roots. More bone than was needed for the interbody arthrodesis was removed to decompress the spinal canal and nerve roots.  PLIF's were performed at L4/5 in the same fashion. I opened the disc space with a 15 blade then used a variety of instruments to remove the disc and prepare the space for the arthrodesis. I used curettes, rongeurs, punches, shavers for the disc space, and rasps in the discetomy. I measured the disc space and placed 10-63mm expandable titanium cages packed with autograft morsels  (Stryker) into the disc space(s).   I placed pedicle screws at L4 and L5, using fluoroscopic guidance. I drilled a pilot hole, then cannulated the pedicle with a drill at each site. I then tapped each pedicle, assessing each site for pedicle violations. No cutouts were appreciated. Screws Livingston Diones) were then placed at each site without difficulty. I attached rods and locking caps with the appropriate tools. The locking caps were secured with torque limited screwdrivers. Final films were performed and the final construct appeared to be in good position.  I closed the wound in a layered fashion. I approximated the thoracolumbar fascia, subcutaneous, and subcuticular planes with vicryl sutures. I used dermabond for a sterile dressing.  I placed 6.5 x62mm screws at L4, and 7.5x77mm screws at L5   PLAN OF CARE: Admit to inpatient   PATIENT DISPOSITION:  PACU - hemodynamically stable.   Delay start of Pharmacological VTE agent (>24hrs) due to surgical blood loss or risk of bleeding:  yes

## 2022-04-15 NOTE — Anesthesia Preprocedure Evaluation (Addendum)
Anesthesia Evaluation  Patient identified by MRN, date of birth, ID band Patient awake    Reviewed: Allergy & Precautions, NPO status , Patient's Chart, lab work & pertinent test results  Airway Mallampati: II  TM Distance: >3 FB Neck ROM: Full    Dental no notable dental hx.    Pulmonary sleep apnea    Pulmonary exam normal        Cardiovascular hypertension, Pt. on medications Normal cardiovascular exam     Neuro/Psych negative neurological ROS  negative psych ROS   GI/Hepatic negative GI ROS, Neg liver ROS,,,  Endo/Other  negative endocrine ROS    Renal/GU negative Renal ROS     Musculoskeletal   Abdominal  (+) + obese  Peds  Hematology negative hematology ROS (+)   Anesthesia Other Findings   Reproductive/Obstetrics                             Anesthesia Physical Anesthesia Plan  ASA: 2  Anesthesia Plan: General   Post-op Pain Management:    Induction: Intravenous  PONV Risk Score and Plan: 2 and Ondansetron, Dexamethasone, Midazolam and Treatment may vary due to age or medical condition  Airway Management Planned: Oral ETT  Additional Equipment:   Intra-op Plan:   Post-operative Plan: Extubation in OR  Informed Consent: I have reviewed the patients History and Physical, chart, labs and discussed the procedure including the risks, benefits and alternatives for the proposed anesthesia with the patient or authorized representative who has indicated his/her understanding and acceptance.     Dental advisory given  Plan Discussed with: CRNA  Anesthesia Plan Comments:        Anesthesia Quick Evaluation

## 2022-04-15 NOTE — H&P (Signed)
There were no vitals taken for this visit. Today I saw the [name] in the office for evaluation of pain he has in his back and left lower extremity.  What I will do is order an MRI and then see him in the office so we could see if there is any problem going on in his neural canal or from a neural element.       Thank you for your referral.     [name] is a 51 year old gentleman who presents today for evaluation of pain that he has had since September or October of 2022 in his left buttocks and into the left lower extremity.  He says it felt like it was inflamed and the pain has progressed.  Feeling of numbness as if the leg were asleep going down the left lower extremity.  He has undergone chiropractic treatment and he has had a cortisone injection from his PCP.  He has no bowel or bladder dysfunction.  He denies sexual dysfunction.  The pain is certainly increased when he walks or stands.  Never has pain when he is lying down.     VITAL SIGNS :  Temperature of 98.2, blood pressure 125/83.  He weighs 239 pounds, is 5 feet 10 inches, pulse is 69, respiratory rate 20, pain is 4/10.       The exact date he gave was 12/26/2020.  He says that there is no particular reason for it.  He takes Lisinopril and Fenofibrate.     REVIEW OF SYSTEMS :  Positive for seasonal allergies, back pain, arm and leg pain.     PAST MEDICAL HISTORY :  Includes hypertension, Legg-Perthes disease.  He has no hereditary diseases present in his body.  He has had steroid injections, chiropractic treatment, x-rays, blood work.  He does feel weakness in the lower extremity.  He has no bowel or bladder dysfunction.  He has no sexual dysfunction.     SOCIAL HISTORY :  He is married.  He does have children. He is right-handed.  He does use alcohol.  He does not smoke.     PHYSICAL EXAMINATION :  He is alert, oriented by 4.  He answers all questions appropriately.  Memory, language, attention span,  and fund of knowledge are normal.  Speech is clear, it is also fluent.  Hearing intact to voice.  Uvula elevates in midline.  Shoulder shrug is normal. 5/5 strength in the upper and lower extremities.  Reflexes are intact at the knees, ankles, biceps, triceps, and brachioradialis.  Romberg is negative.  He can toe walk, heel walk, and he can do a squat.     IMAGING :  X-rays of the lumbar spine show only minimal disc height loss at L5-S1.  He has a normal lordosis.  He is straight.  No scoliosis present.  He has 5 lumbar vertebrae.     [name] has had a 31-month history of pain.  He has had chiropractic treatment, physical therapy.  He has had injections and he has been treated conservatively.  Given the amount of time given the fact that there has been no improvement and given his pain, I have elected to order an MRI of the lumbar spine without contrast.  I will see him in the office after we have that study.   [name] returns today so we can take a look at his lumbar spine.  Essentially, [name] has congenital stenosis, but he certainly is stenotic at  the L4-5 level, where he has a very small amount of anterolisthesis at 4 and 5, but he certainly is stenotic, he certainly has a good deal of stenosis there.  I think it does fit as he says if he is lying down or sitting down he does not have the problems with pain or numbness.  The biggest mystery is he speaks about his penis being numb 2-3 times a day, his inability to urinate when his penis is numb, and the numbness that he feels radiating around into the groin and into his penis.  He has some stenosis present at 2-3, but I do not really think that this is the Nicholas, and again, this occurs if he is standing for too long.  Obviously, 5-1 cannot affect the groin, but it could affect the penis.  We went over in detail the various operative options that are available.  I could do what I think is a definitive procedure, decompress him and place  screws, inferior facetectomies of L4, partial superior facetectomies at L5, to decompress his nerve root and the canal.  But, that obviously is also the most invasive procedure.  We discussed doing a simple laminectomy, which can certainly relieve the central component, but the neural foramen and lateral recess would be essentially untouched. He would like to proceed with a plif at L4/5

## 2022-04-15 NOTE — Progress Notes (Signed)
Supper tray ordered for patient.  

## 2022-04-15 NOTE — Plan of Care (Signed)
  Problem: Education: Goal: Knowledge of General Education information will improve Description: Including pain rating scale, medication(s)/side effects and non-pharmacologic comfort measures Outcome: Progressing   Problem: Clinical Measurements: Goal: Ability to maintain clinical measurements within normal limits will improve Outcome: Progressing Goal: Will remain free from infection Outcome: Progressing   Problem: Activity: Goal: Risk for activity intolerance will decrease Outcome: Progressing   Problem: Nutrition: Goal: Adequate nutrition will be maintained Outcome: Progressing   Problem: Pain Managment: Goal: General experience of comfort will improve Outcome: Progressing

## 2022-04-15 NOTE — Progress Notes (Signed)
Attempted to call report, 5N unable to take report at this time.

## 2022-04-16 DIAGNOSIS — M4316 Spondylolisthesis, lumbar region: Secondary | ICD-10-CM | POA: Diagnosis not present

## 2022-04-16 MED ORDER — OXYCODONE HCL ER 10 MG PO T12A
20.0000 mg | EXTENDED_RELEASE_TABLET | Freq: Two times a day (BID) | ORAL | Status: AC
  Administered 2022-04-16: 20 mg via ORAL
  Filled 2022-04-16: qty 2

## 2022-04-16 MED ORDER — OXYCODONE HCL 5 MG PO TABS: 5.0000 mg | ORAL_TABLET | Freq: Four times a day (QID) | ORAL | 0 refills | Status: AC | PRN

## 2022-04-16 NOTE — Discharge Instructions (Signed)

## 2022-04-16 NOTE — Discharge Summary (Addendum)
Physician Discharge Summary  Patient ID: Nicholas Newton MRN: [id number] DOB/AGE: 49-Jul-1972 49 y.o.  Admit date: 04/15/2022 Discharge date: 04/16/2022  Admission Diagnoses:L4/5 spondylolisthesis, lumbar stenosis  Discharge Diagnoses: same Principal Problem:   Spondylolisthesis of lumbar region   Discharged Condition: good  Hospital Course: Mr. Goodlin was admitted and taken to the operating room for decompression and arthrodesis via a PLIF at L4/5. Post op he is ambulating, and voiding well. He is tolerating a regular diet. The incision is clean, dry, and without signs of infection.   Treatments: surgery: L4/5 PLIF with Stryker expandable cages Non segmental pedicle screw fixation L4-5, nuvasive mas plif screws   Discharge Exam: Blood pressure 135/83, pulse 85, temperature 98.3 F (36.8 C), temperature source Oral, resp. rate 18, height 5\' 10"  (1.778 m), weight 117 kg, SpO2 96 %. General appearance: alert, cooperative, and no distress  Disposition: Discharge disposition: 01-Home or Self Care      Spondylolisthesis, Lumbar region  Allergies as of 04/16/2022   No Known Allergies      Medication List     STOP taking these medications    ibuprofen 200 MG tablet Commonly known as: ADVIL       TAKE these medications    APPLE CIDER VINEGAR PO Take 450 mg by mouth in the morning.   aspirin EC 81 MG tablet Take 81 mg by mouth in the morning. Swallow whole.   BIOFREEZE EX Apply 1 Application topically 3 (three) times daily as needed (pain.).   COSAMIN DS PO Take 1 tablet by mouth in the morning.   cyclobenzaprine 10 MG tablet Commonly known as: FLEXERIL Take 10 mg by mouth 2 (two) times daily as needed for muscle spasms.   fenofibrate 160 MG tablet Take 160 mg by mouth in the morning.   Garlic 1000 MG Caps Take 1,000 mg by mouth in the morning.   lisinopril-hydrochlorothiazide 20-12.5 MG tablet Commonly known as: ZESTORETIC Take 1 tablet by mouth in  the morning.   oxyCODONE 5 MG immediate release tablet Commonly known as: Oxy IR/ROXICODONE Take 1 tablet (5 mg total) by mouth every 6 (six) hours as needed for up to 8 days for moderate pain ((score 4 to 6)).   Systane Contacts Soln Apply 1 Application to eye as needed (eye irritation.).   Turmeric 500 MG Caps Take 500 mg by mouth in the morning.   ZINC PO Take 1 tablet by mouth daily as needed (immune support).        Follow-up Information     04/18/2022, MD Follow up.   Specialty: Neurosurgery Contact information: [address] [place] [phone]                 Signed: [phone] 04/16/2022, 6:43 PM

## 2022-04-16 NOTE — Evaluation (Signed)
Occupational Therapy Evaluation Patient Details Name: Nicholas Newton MRN: [id number] DOB: [birth year] Today's Date: 04/16/2022   History of Present Illness 51 yo s/p L4-5 PLIF. QTM:[id number] disease, HTN.   Clinical Impression   Pt states his leg pain has significantly improved since surgery. Completed all education regarding ADL and functional mobility management s/p back surgery. Handout reviewed and wife present for education. Pt modified independent with mobility and wife can assist with ADL as needed. Educated on availability of AE to help with ADL tasks as needed. No further OT needed.       Recommendations for follow up therapy are one component of a multi-disciplinary discharge planning process, led by the attending physician.  Recommendations may be updated based on patient status, additional functional criteria and insurance authorization.   Follow Up Recommendations  No OT follow up     Assistance Recommended at Discharge Intermittent Supervision/Assistance  Patient can return home with the following A little help with bathing/dressing/bathroom    Functional Status Assessment  Patient has not had a recent decline in their functional status  Equipment Recommendations  None recommended by OT    Recommendations for Other Services       Precautions / Restrictions Precautions Precautions: Back Precaution Booklet Issued: Yes (comment)      Mobility Bed Mobility               General bed mobility comments: OOB in chair. REviewed technique    Transfers Overall transfer level: Modified independent                        Balance Overall balance assessment: No apparent balance deficits (not formally assessed)                                         ADL either performed or assessed with clinical judgement   ADL Overall ADL's : Needs assistance/impaired                                     Functional mobility  during ADLs: Modified independent General ADL Comments: usually ablet o complete figure four positioning however limited at this time due to srugical soreness; Educated on compensatory techniques for ADL adn use of AE/toilet tong for pericare if needed. Pt got dressed during session to review information. Wife present.     Vision Baseline Vision/History: 1 Wears glasses       Perception     Praxis      Pertinent Vitals/Pain Pain Assessment Pain Assessment: 0-10 Pain Score: 2  Pain Location: back Pain Descriptors / Indicators: Discomfort, Operative site guarding Pain Intervention(s): Limited activity within patient's tolerance     Hand Dominance     Extremity/Trunk Assessment Upper Extremity Assessment Upper Extremity Assessment: Overall WFL for tasks assessed ("some tingling in fingertips")   Lower Extremity Assessment Lower Extremity Assessment: Overall WFL for tasks assessed (LLE "not as flexible"; improved since suregery - no longer having shooting pain down leg)       Communication     Cognition Arousal/Alertness: Awake/alert Behavior During Therapy: WFL for tasks assessed/performed Overall Cognitive Status: Within Functional Limits for tasks assessed  General Comments       Exercises     Shoulder Instructions      Home Living Family/patient expects to be discharged to:: Private residence Living Arrangements: Spouse/significant other Available Help at Discharge: Available 24 hours/day Type of Home: House Home Access: Stairs to enter     Home Layout: Two level;Able to live on main level with bedroom/bathroom     Bathroom Shower/Tub: Producer, television/film/video: Standard Bathroom Accessibility: Yes How Accessible: Accessible via walker Home Equipment: Hand held shower head;Shower seat - built in          Prior Functioning/Environment Prior Level of Function : Independent/Modified  Independent (works as Production designer, theatre/television/film at [employer])                        OT Problem List: Decreased knowledge of use of DME or AE;Decreased knowledge of precautions;Pain      OT Treatment/Interventions:      OT Goals(Current goals can be found in the care plan section) Acute Rehab OT Goals Patient Stated Goal: home today OT Goal Formulation: All assessment and education complete, DC therapy  OT Frequency:      Co-evaluation              AM-PAC OT "6 Clicks" Daily Activity     Outcome Measure Help from another person eating meals?: None Help from another person taking care of personal grooming?: A Little Help from another person toileting, which includes using toliet, bedpan, or urinal?: A Little Help from another person bathing (including washing, rinsing, drying)?: A Little Help from another person to put on and taking off regular upper body clothing?: A Little Help from another person to put on and taking off regular lower body clothing?: A Little 6 Click Score: 19   End of Session Nurse Communication: Mobility status;Other (comment) (DC needs)  Activity Tolerance: Patient tolerated treatment well Patient left: in chair;with call bell/phone within reach;with family/visitor present  OT Visit Diagnosis: Other abnormalities of gait and mobility (R26.89);Pain Pain - part of body:  (back)                Time: 1157-2620 OT Time Calculation (min): 25 min Charges:  OT General Charges $OT Visit: 1 Visit OT Evaluation $OT Eval Low Complexity: 1 Low  Jann Ra, OT/L   Acute OT Clinical Specialist Acute Rehabilitation Services Pager [phone] Office [phone]   [facility] 04/16/2022, 9:27 AM

## 2022-04-16 NOTE — Progress Notes (Signed)
Per patient, he is "still waiting to be seen by the doctor to be discharged." Patient says "this is ridiculous-I've been waiting all day." Nurse apologized and reassured patient that she would try to contact the neurosurgery team again. Neurosurgery office called. Per receptionist she will "call Aundra Millet and have her call you." Nurse callback number given. Charge nurse Lauren made aware of patient's concerns. Patient's wife at bedside.

## 2022-04-16 NOTE — Progress Notes (Signed)
PT Cancellation Note  Patient Details Name: Nicholas Newton MRN: [id number] DOB: [birth year]   Cancelled Treatment:    Reason Eval/Treat Not Completed: OT screened, no needs identified, will sign off (OT screened pt and states they are independent with gait and stairs. No further need for PT Services.)   Harrel Carina, DPT, CLT  Acute Rehabilitation Services Office: [phone] (Secure chat preferred)   [name] 04/16/2022, 10:49 AM

## 2022-04-16 NOTE — Anesthesia Postprocedure Evaluation (Signed)
Anesthesia Post Note  Patient: Nicholas Newton  Procedure(s) Performed: Lumbar Four-Five Posterior Lumbar Interbody Fusion     Patient location during evaluation: PACU Anesthesia Type: General Level of consciousness: awake and alert Pain management: pain level controlled Vital Signs Assessment: post-procedure vital signs reviewed and stable Respiratory status: spontaneous breathing, nonlabored ventilation, respiratory function stable and patient connected to nasal cannula oxygen Cardiovascular status: blood pressure returned to baseline and stable Postop Assessment: no apparent nausea or vomiting Anesthetic complications: no   No notable events documented.  Last Vitals:  Vitals:   04/15/22 2059 04/16/22 0509  BP: 136/75 124/80  Pulse: (!) 103 76  Resp: 17 16  Temp: 36.9 C 36.8 C  SpO2: 94% 99%    Last Pain:  Vitals:   04/16/22 0615  TempSrc:   PainSc: 8                  Collene Schlichter

## 2022-04-16 NOTE — Progress Notes (Signed)
Patient given written and verbal discharge instructions-verbalized understanding. Scheduled Tylenol and scheduled Oxycontin given to patient prior to discharge per provider. Patient wife and sister at bedside. Patient voices no questions or concerns at this time.

## 2022-04-16 NOTE — Progress Notes (Addendum)
Per patient, he is supposed to be discharged today. Patient requesting IV's to be removed. IV x2 removed with no complications. Neurosurgery team paged. Per neuro, "Dr. Franky Macho is off but may still round on patient and put in discharge orders this afternoon. If he doesn't, Aundra Millet will come after clinic." Patient made aware of updates. Charge nurse Lauren also made aware of updates.

## 2022-04-16 NOTE — Progress Notes (Signed)
Patient at nurses station asking about discharge. Patient made aware that nurse is waiting to receive callback from neurosurgery team. Patient requesting to speak with charge nurse. Charge nurse Lauren made aware and verbalized understanding.

## 2022-04-16 NOTE — TOC Initial Note (Signed)
Transition of Care [facility]) - Initial/Assessment Note    Patient Details  Name: Nicholas Newton MRN: [id number] Date of Birth: 05/10/1970  Transition of Care [facility]) CM/SW Contact:    Lockie Pares, RN Phone Number: 04/16/2022, 10:53 AM  Clinical Narrative:                  Transitions of care ( TOC) has reviewed the chart and found no needs at this time. The patient will be discussed in progressive rounds daily. If a need is identified, please place a TOC consult       Patient Goals and CMS Choice            Expected Discharge Plan and Services                                              Prior Living Arrangements/Services                       Activities of Daily Living Home Assistive Devices/Equipment: Scales, Blood pressure cuff, Eyeglasses, Contact lenses ADL Screening (condition at time of admission) Patient's cognitive ability adequate to safely complete daily activities?: Yes Is the patient deaf or have difficulty hearing?: No Does the patient have difficulty seeing, even when wearing glasses/contacts?: No Does the patient have difficulty concentrating, remembering, or making decisions?: No Patient able to express need for assistance with ADLs?: Yes Does the patient have difficulty dressing or bathing?: No Independently performs ADLs?: Yes (appropriate for developmental age) Does the patient have difficulty walking or climbing stairs?: Yes Weakness of Legs: None Weakness of Arms/Hands: None  Permission Sought/Granted                  Emotional Assessment              Admission diagnosis:  Spondylolisthesis of lumbar region [M43.16] Patient Active Problem List   Diagnosis Date Noted   Spondylolisthesis of lumbar region 04/15/2022   Essential hypertension 06/27/2014   PCP:  Benita Stabile, MD Pharmacy:   [facility] [phone] - [place], [place] - [address] AT [facility] [address]  [place] [address] Phone: [phone] Fax: [phone]     Social Determinants of Health (SDOH) Social History: SDOH Screenings   Tobacco Use: Unknown (04/15/2022)   SDOH Interventions:     Readmission Risk Interventions     No data to display

## 2022-05-05 DIAGNOSIS — M4316 Spondylolisthesis, lumbar region: Secondary | ICD-10-CM | POA: Diagnosis not present

## 2022-05-05 DIAGNOSIS — Z6836 Body mass index (BMI) 36.0-36.9, adult: Secondary | ICD-10-CM | POA: Diagnosis not present

## 2022-05-13 DIAGNOSIS — E782 Mixed hyperlipidemia: Secondary | ICD-10-CM | POA: Diagnosis not present

## 2022-05-13 DIAGNOSIS — I10 Essential (primary) hypertension: Secondary | ICD-10-CM | POA: Diagnosis not present

## 2022-05-13 DIAGNOSIS — R7301 Impaired fasting glucose: Secondary | ICD-10-CM | POA: Diagnosis not present

## 2022-08-10 DIAGNOSIS — R1011 Right upper quadrant pain: Secondary | ICD-10-CM | POA: Diagnosis not present

## 2022-08-28 DIAGNOSIS — R252 Cramp and spasm: Secondary | ICD-10-CM | POA: Diagnosis not present

## 2022-08-28 DIAGNOSIS — M791 Myalgia, unspecified site: Secondary | ICD-10-CM | POA: Diagnosis not present

## 2022-09-03 DIAGNOSIS — M4316 Spondylolisthesis, lumbar region: Secondary | ICD-10-CM | POA: Diagnosis not present

## 2022-09-03 DIAGNOSIS — Z6837 Body mass index (BMI) 37.0-37.9, adult: Secondary | ICD-10-CM | POA: Diagnosis not present

## 2022-09-07 ENCOUNTER — Ambulatory Visit (HOSPITAL_COMMUNITY)
Admission: RE | Admit: 2022-09-07 | Discharge: 2022-09-07 | Disposition: A | Discharge status: Home / Self Care | Payer: No Typology Code available for payment source | Source: Ambulatory Visit | Attending: Family Medicine | Admitting: Family Medicine

## 2022-09-07 ENCOUNTER — Other Ambulatory Visit (HOSPITAL_COMMUNITY): Payer: Self-pay | Admitting: Family Medicine

## 2022-09-07 DIAGNOSIS — R262 Difficulty in walking, not elsewhere classified: Secondary | ICD-10-CM

## 2022-09-07 DIAGNOSIS — R531 Weakness: Secondary | ICD-10-CM | POA: Diagnosis not present

## 2022-09-09 ENCOUNTER — Other Ambulatory Visit (HOSPITAL_COMMUNITY): Payer: Self-pay | Admitting: Neurosurgery

## 2022-09-09 DIAGNOSIS — M4316 Spondylolisthesis, lumbar region: Secondary | ICD-10-CM

## 2022-09-10 ENCOUNTER — Encounter: Payer: Self-pay | Admitting: Diagnostic Neuroimaging

## 2022-09-10 ENCOUNTER — Ambulatory Visit: Payer: No Typology Code available for payment source | Admitting: Diagnostic Neuroimaging

## 2022-09-10 VITALS — BP 145/87 | HR 86 | Ht 70.0 in | Wt 258.0 lb

## 2022-09-10 DIAGNOSIS — M48062 Spinal stenosis, lumbar region with neurogenic claudication: Secondary | ICD-10-CM | POA: Diagnosis not present

## 2022-09-10 NOTE — Patient Instructions (Signed)
  LUMBAR SPINAL STENOSIS - follow up MRI scan this Sunday

## 2022-09-10 NOTE — Progress Notes (Signed)
[facility]  PATIENT: Nicholas Newton DOB: 08/19/1970  REFERRING CLINICIAN: Leone Payor, FNP HISTORY FROM: patient and wife  REASON FOR VISIT: new consult   HISTORICAL  CHIEF COMPLAINT:  Chief Complaint  Patient presents with   Gait Problem    Rm6, pt with wife Nicholas Newton Pt is having difficulty walking x 3 weeks, tightness/pain in calves and thighs, with numbness on right side. No onset triggers.    HISTORY OF PRESENT ILLNESS:   52 year old male here for evaluation of lower extremity pain and weakness.  August 2023 patient had onset of low back pain rating to the left leg.  He had MRI of the lumbar spine demonstrating severe spinal stenosis at L4-5.  He underwent L4-5 decompression and fusion surgery on 04/15/2022.  He had a good result and went back to his activities.  About 3 weeks ago in early May 2024 patient woke up with sudden onset of tight cramping pain in bilateral calves.  Within 1 to 2 days this moved up to his thighs.  Now he has severe sharp pain in his lower extremities.  Also some increased low back pain.  Symptoms are relieved when he leans forward or sits down.  Symptoms significantly aggravated if he stands up or leans backwards.  He went to see neurosurgery and they have ordered MRI of the lumbar spine which is planned for this Sunday.  No other accidents injuries or traumas.  No prodromal fevers or chills or change in activities.  No problems with upper extremities.  No vision speech or swallowing issues.  No neck pain.   REVIEW OF SYSTEMS: Full 14 system review of systems performed and negative with exception of: as per HPI.  ALLERGIES: No Known Allergies  HOME MEDICATIONS: Outpatient Medications Prior to Visit  Medication Sig Dispense Refill   APPLE CIDER VINEGAR PO Take 450 mg by mouth in the morning.     Artificial Tear Solution (SYSTANE CONTACTS) SOLN Apply 1 Application to eye as needed (eye irritation.).     aspirin EC 81 MG  tablet Take 81 mg by mouth in the morning. Swallow whole.     cyclobenzaprine (FLEXERIL) 10 MG tablet Take 10 mg by mouth 2 (two) times daily as needed for muscle spasms.     fenofibrate 160 MG tablet Take 160 mg by mouth in the morning.     Garlic 1000 MG CAPS Take 1,000 mg by mouth in the morning.     Glucosamine-Chondroitin (COSAMIN DS PO) Take 1 tablet by mouth in the morning.     lisinopril-hydrochlorothiazide (ZESTORETIC) 20-12.5 MG tablet Take 1 tablet by mouth in the morning.     meloxicam (MOBIC) 7.5 MG tablet Take 7.5 mg by mouth daily.     Menthol, Topical Analgesic, (BIOFREEZE EX) Apply 1 Application topically 3 (three) times daily as needed (pain.).     Multiple Vitamins-Minerals (ZINC PO) Take 1 tablet by mouth daily as needed (immune support).     predniSONE (DELTASONE) 10 MG tablet Take 10 mg by mouth daily with breakfast.     Turmeric 500 MG CAPS Take 500 mg by mouth in the morning.     No facility-administered medications prior to visit.    PAST MEDICAL HISTORY: Past Medical History:  Diagnosis Date   Hypertension    Legg-Perthes disease, left    Left Hip   Pre-diabetes    Sleep apnea     PAST SURGICAL HISTORY: Past Surgical History:  Procedure Laterality Date   BACK  SURGERY  04/15/2022   fusion   HIP SURGERY Left 1982   To correct Leg Perthes Disease    FAMILY HISTORY: History reviewed. No pertinent family history.  SOCIAL HISTORY: Social History   Socioeconomic History   Marital status: Married    Spouse name: Not on file   Number of children: Not on file   Years of education: Not on file   Highest education level: Not on file  Occupational History   Not on file  Tobacco Use   Smoking status: Never   Smokeless tobacco: Not on file  Substance and Sexual Activity   Alcohol use: Yes   Drug use: No   Sexual activity: Yes  Other Topics Concern   Not on file  Social History Narrative   Not on file   Social Determinants of Health   Financial  Resource Strain: Not on file  Food Insecurity: Not on file  Transportation Needs: Not on file  Physical Activity: Not on file  Stress: Not on file  Social Connections: Not on file  Intimate Partner Violence: Not on file     PHYSICAL EXAM  GENERAL EXAM/CONSTITUTIONAL: Vitals:  Vitals:   09/10/22 1622  BP: (!) 145/87  Pulse: 86  Weight: 258 lb (117 kg)  Height: 5\' 10"  (1.778 m)   Body mass index is 37.02 kg/m. Wt Readings from Last 3 Encounters:  09/10/22 258 lb (117 kg)  04/15/22 258 lb (117 kg)  02/23/22 259 lb 11.2 oz (117.8 kg)   Patient is in no distress; well developed, nourished and groomed; neck is supple  CARDIOVASCULAR: Examination of carotid arteries is normal; no carotid bruits Regular rate and rhythm, no murmurs Examination of peripheral vascular system by observation and palpation is normal  EYES: Ophthalmoscopic exam of optic discs and posterior segments is normal; no papilledema or hemorrhages No results found.  MUSCULOSKELETAL: Gait, strength, tone, movements noted in Neurologic exam below  NEUROLOGIC: MENTAL STATUS:      No data to display         awake, alert, oriented to person, place and time recent and remote memory intact normal attention and concentration language fluent, comprehension intact, naming intact fund of knowledge appropriate  CRANIAL NERVE:  2nd - no papilledema on fundoscopic exam 2nd, 3rd, 4th, 6th - pupils equal and reactive to light, visual fields full to confrontation, extraocular muscles intact, no nystagmus 5th - facial sensation symmetric 7th - facial strength symmetric 8th - hearing intact 9th - palate elevates symmetrically, uvula midline 11th - shoulder shrug symmetric 12th - tongue protrusion midline  MOTOR:  normal bulk and tone, full strength in the BUE BLE --> HIP FLEXION 4+, KNEE EXT 4, KNEE FLEX 3, FOOT DORSIFLEXION 3  SENSORY:  normal and symmetric to light touch, pinprick, temperature,  vibration; EXCEPT DECR IN BLE IN GRADIENT UP TO KNEES  COORDINATION:  finger-nose-finger, fine finger movements normal  REFLEXES:  deep tendon reflexes 1+; ABSENT AT KNEES AND ANKLES  GAIT/STATION:  WIDE GAIT; UNSTEADY; CANNOT WALK ON TOES OR HEELS; USING CANE     DIAGNOSTIC DATA (LABS, IMAGING, TESTING) - I reviewed patient records, labs, notes, testing and imaging myself where available.  Lab Results  Component Value Date   WBC 5.5 04/15/2022   HGB 15.4 04/15/2022   HCT 45.8 04/15/2022   MCV 92.0 04/15/2022   PLT 278 04/15/2022      Component Value Date/Time   NA 138 04/15/2022 1040   K 3.8 04/15/2022 1040   CL  107 04/15/2022 1040   CO2 24 04/15/2022 1040   GLUCOSE 108 (H) 04/15/2022 1040   BUN 15 04/15/2022 1040   CREATININE 0.84 04/15/2022 1040   CALCIUM 9.1 04/15/2022 1040   GFRNONAA >60 04/15/2022 1040   GFRAA >90 05/19/2014 1225   No results found for: "CHOL", "HDL", "LDLCALC", "LDLDIRECT", "TRIG", "CHOLHDL" No results found for: "HGBA1C" No results found for: "VITAMINB12" No results found for: "TSH"  09/07/22 CT head  - No acute intracranial abnormality.   11/19/21 MRI lumbar spine [I reviewed images myself and agree with interpretation. -VRP]  - Congenitally small spinal canal. - Disc bulges at L1-2, L2-3 and L3-4. Mild multifactorial spinal stenosis at those levels, slightly more pronounced at the L2-3 level. No definite neural compression is not established, the stenosis at L2-3 could possibly reach symptomatic levels. - L4-5 edematous facet arthropathy which could be painful. 3 mm of anterolisthesis. Moderate bulging of the disc. Severe multifactorial stenosis at this level likely to be symptomatic. Similar appearance to the previous examination.   ASSESSMENT AND PLAN  52 y.o. year old male here with   Dx:  1. Spinal stenosis of lumbar region with neurogenic claudication     PLAN:  LUMBAR SPINAL STENOSIS (with neurogenic claudication) -  follow up MRI scan (scheduled for this Sunday 09/13/2022); if this demonstrates worsening spinal stenosis then we will defer care to neurosurgery - if MRI looks unremarkable, then may consider EMG nerve conduction study to look for other causes of peripheral numbness, weakness and pain  Return for pending if symptoms worsen or fail to improve, pending test results.    Suanne Marker, MD 09/10/2022, 4:58 PM Certified in Neurology, Neurophysiology and Neuroimaging  [facility] [address] [place], [place] [address] [phone]

## 2022-09-13 ENCOUNTER — Ambulatory Visit (HOSPITAL_COMMUNITY)
Admission: RE | Admit: 2022-09-13 | Discharge: 2022-09-13 | Disposition: A | Discharge status: Home / Self Care | Payer: 59 | Source: Ambulatory Visit | Attending: Neurosurgery | Admitting: Neurosurgery

## 2022-09-13 DIAGNOSIS — M4316 Spondylolisthesis, lumbar region: Secondary | ICD-10-CM | POA: Insufficient documentation

## 2022-09-13 DIAGNOSIS — M48061 Spinal stenosis, lumbar region without neurogenic claudication: Secondary | ICD-10-CM | POA: Diagnosis not present

## 2022-09-13 DIAGNOSIS — M5126 Other intervertebral disc displacement, lumbar region: Secondary | ICD-10-CM | POA: Diagnosis not present

## 2022-09-13 MED ORDER — GADOBUTROL 1 MMOL/ML IV SOLN
10.0000 mL | Freq: Once | INTRAVENOUS | Status: AC | PRN
  Administered 2022-09-13: 10 mL via INTRAVENOUS

## 2022-10-06 DIAGNOSIS — M4316 Spondylolisthesis, lumbar region: Secondary | ICD-10-CM | POA: Diagnosis not present

## 2022-10-06 DIAGNOSIS — M5442 Lumbago with sciatica, left side: Secondary | ICD-10-CM | POA: Diagnosis not present

## 2022-10-06 DIAGNOSIS — Z6836 Body mass index (BMI) 36.0-36.9, adult: Secondary | ICD-10-CM | POA: Diagnosis not present

## 2022-10-07 ENCOUNTER — Ambulatory Visit (HOSPITAL_COMMUNITY): Payer: No Typology Code available for payment source

## 2022-11-03 ENCOUNTER — Encounter: Payer: Self-pay | Admitting: Diagnostic Neuroimaging

## 2022-12-03 ENCOUNTER — Encounter: Payer: No Typology Code available for payment source | Admitting: Diagnostic Neuroimaging

## 2022-12-04 DIAGNOSIS — I10 Essential (primary) hypertension: Secondary | ICD-10-CM | POA: Diagnosis not present

## 2022-12-04 DIAGNOSIS — E291 Testicular hypofunction: Secondary | ICD-10-CM | POA: Diagnosis not present

## 2022-12-04 DIAGNOSIS — R7301 Impaired fasting glucose: Secondary | ICD-10-CM | POA: Diagnosis not present

## 2022-12-10 ENCOUNTER — Ambulatory Visit: Payer: No Typology Code available for payment source | Admitting: Diagnostic Neuroimaging

## 2022-12-10 ENCOUNTER — Ambulatory Visit: Payer: Self-pay | Admitting: Diagnostic Neuroimaging

## 2022-12-10 DIAGNOSIS — R29898 Other symptoms and signs involving the musculoskeletal system: Secondary | ICD-10-CM

## 2022-12-10 DIAGNOSIS — Z0289 Encounter for other administrative examinations: Secondary | ICD-10-CM

## 2022-12-10 NOTE — Procedures (Signed)
[facility]  NCS (NERVE CONDUCTION STUDY) WITH EMG (ELECTROMYOGRAPHY) REPORT   STUDY DATE: 12/10/22 PATIENT NAME: Nicholas Newton DOB: 06-19-1970 MRN: [id number]  ORDERING CLINICIAN: Joycelyn Schmid, MD   TECHNOLOGIST: Jenelle Mages ELECTROMYOGRAPHER: Glenford Bayley. Reyan Helle, MD  CLINICAL INFORMATION: 52 year old male with lower extremity weakness.  Rapid onset in early May 2024, lasting 8 weeks and then resolving.  History of low back pain and lumbar decompression surgery in December 2023.  FINDINGS: NERVE CONDUCTION STUDY:  Bilateral peroneal and tibial motor responses have normal distal latencies, amplitudes and conduction velocities.  Bilateral sural and superficial peroneal sensory responses are normal.  Bilateral tibial F wave latencies are normal.    NEEDLE ELECTROMYOGRAPHY:  Needle examination of left deltoid, left vastus medialis, left tibialis anterior and left gastrocnemius muscles notable for: Abnormal spontaneous activity in left tibialis anterior and left gastrocnemius muscles at rest with decreased recruitment of large motor units on exertion.  Other muscles are normal.   IMPRESSION:   Normal nerve conduction studies.  Active and chronic denervation noted in left tibialis anterior and left gastrocnemius muscles on needle EMG.  May be related to lumbar radiculopathies.  Needle EMG of lumbar paraspinal muscles deferred due to history of surgery.    INTERPRETING PHYSICIAN:  Suanne Marker, MD Certified in Neurology, Neurophysiology and Neuroimaging  [facility] [address] [place], [place] [address] [phone]   [facility]    Nerve / Sites Muscle Latency Ref. Amplitude Ref. Rel Amp Segments Distance Velocity Ref. Area    ms ms mV mV %  cm m/s m/s mVms  L Peroneal - EDB     Ankle EDB 4.5 ?6.5 3.8 ?2.0 100 Ankle - EDB 9   10.2     Fib head EDB 11.6  3.9  102 Fib head - Ankle 32 45 ?44 10.6     Pop fossa EDB 13.7   3.6  92.5 Pop fossa - Fib head 9 44 ?44 10.3         Pop fossa - Ankle      R Peroneal - EDB     Ankle EDB 5.2 ?6.5 3.9 ?2.0 100 Ankle - EDB 9   10.2     Fib head EDB 12.5  4.0  102 Fib head - Ankle 31.6 43 ?44 10.7     Pop fossa EDB 14.2  4.0  101 Pop fossa - Fib head 10 59 ?44 11.0         Pop fossa - Ankle      L Tibial - AH     Ankle AH 4.5 ?5.8 6.2 ?4.0 100 Ankle - AH 9   18.3     Pop fossa AH 13.8  5.0  80.5 Pop fossa - Ankle 40.6 44 ?41 18.7  R Tibial - AH     Ankle AH 4.6 ?5.8 5.4 ?4.0 100 Ankle - AH 9   20.3     Pop fossa AH 13.8  5.0  91.2 Pop fossa - Ankle 42.6 47 ?41 18.1             SNC    Nerve / Sites Rec. Site Peak Lat Ref.  Amp Ref. Segments Distance    ms ms V V  cm  L Sural - Ankle (Calf)     Calf Ankle 3.2 ?4.4 7 ?6 Calf - Ankle 14  R Sural - Ankle (Calf)     Calf Ankle 3.5 ?4.4 6 ?6 Calf -  Ankle 14  L Superficial peroneal - Ankle     Lat leg Ankle 3.7 ?4.4 8 ?6 Lat leg - Ankle 14  R Superficial peroneal - Ankle     Lat leg Ankle 4.0 ?4.4 7 ?6 Lat leg - Ankle 14             F  Wave    Nerve F Lat Ref.   ms ms  L Tibial - AH 57.7 ?56.0  R Tibial - AH 54.0 ?56.0         EMG Summary Table    Spontaneous MUAP Recruitment  Muscle IA Fib PSW Fasc Other Amp Dur. Poly Pattern  L. Vastus medialis Normal None None None _______ Normal Normal Normal Normal  L. Tibialis anterior Normal 2+ 2+ None _______ Increased Normal Normal Reduced  L. Gastrocnemius (Medial head) Normal 2+ 2+ None _______ Increased Normal Normal Reduced  L. Deltoid Normal None None None _______ Normal Normal Normal Normal

## 2022-12-22 DIAGNOSIS — M4316 Spondylolisthesis, lumbar region: Secondary | ICD-10-CM | POA: Diagnosis not present

## 2022-12-31 DIAGNOSIS — E669 Obesity, unspecified: Secondary | ICD-10-CM | POA: Diagnosis not present

## 2022-12-31 DIAGNOSIS — I10 Essential (primary) hypertension: Secondary | ICD-10-CM | POA: Diagnosis not present

## 2022-12-31 DIAGNOSIS — E782 Mixed hyperlipidemia: Secondary | ICD-10-CM | POA: Diagnosis not present

## 2022-12-31 DIAGNOSIS — Z0000 Encounter for general adult medical examination without abnormal findings: Secondary | ICD-10-CM | POA: Diagnosis not present

## 2022-12-31 DIAGNOSIS — Z6836 Body mass index (BMI) 36.0-36.9, adult: Secondary | ICD-10-CM | POA: Diagnosis not present

## 2022-12-31 DIAGNOSIS — E291 Testicular hypofunction: Secondary | ICD-10-CM | POA: Diagnosis not present

## 2022-12-31 DIAGNOSIS — Z79899 Other long term (current) drug therapy: Secondary | ICD-10-CM | POA: Diagnosis not present

## 2022-12-31 DIAGNOSIS — R7303 Prediabetes: Secondary | ICD-10-CM | POA: Diagnosis not present

## 2022-12-31 DIAGNOSIS — M4310 Spondylolisthesis, site unspecified: Secondary | ICD-10-CM | POA: Diagnosis not present

## 2023-06-29 DIAGNOSIS — I10 Essential (primary) hypertension: Secondary | ICD-10-CM | POA: Diagnosis not present

## 2023-06-29 DIAGNOSIS — R7303 Prediabetes: Secondary | ICD-10-CM | POA: Diagnosis not present

## 2023-09-15 DIAGNOSIS — L570 Actinic keratosis: Secondary | ICD-10-CM | POA: Diagnosis not present

## 2023-09-15 DIAGNOSIS — L814 Other melanin hyperpigmentation: Secondary | ICD-10-CM | POA: Diagnosis not present

## 2023-09-15 DIAGNOSIS — I781 Nevus, non-neoplastic: Secondary | ICD-10-CM | POA: Diagnosis not present

## 2023-09-15 DIAGNOSIS — L573 Poikiloderma of Civatte: Secondary | ICD-10-CM | POA: Diagnosis not present

## 2023-10-07 DIAGNOSIS — D485 Neoplasm of uncertain behavior of skin: Secondary | ICD-10-CM | POA: Diagnosis not present

## 2023-10-07 DIAGNOSIS — D234 Other benign neoplasm of skin of scalp and neck: Secondary | ICD-10-CM | POA: Diagnosis not present

## 2023-12-08 DIAGNOSIS — R7303 Prediabetes: Secondary | ICD-10-CM | POA: Diagnosis not present

## 2023-12-08 DIAGNOSIS — E782 Mixed hyperlipidemia: Secondary | ICD-10-CM | POA: Diagnosis not present

## 2023-12-14 ENCOUNTER — Other Ambulatory Visit (HOSPITAL_COMMUNITY): Payer: Self-pay | Admitting: Family Medicine

## 2023-12-14 DIAGNOSIS — E782 Mixed hyperlipidemia: Secondary | ICD-10-CM

## 2023-12-22 DIAGNOSIS — Z1211 Encounter for screening for malignant neoplasm of colon: Secondary | ICD-10-CM | POA: Diagnosis not present

## 2023-12-30 LAB — COLOGUARD: COLOGUARD: NEGATIVE

## 2024-01-13 ENCOUNTER — Ambulatory Visit (HOSPITAL_COMMUNITY)
Admission: RE | Admit: 2024-01-13 | Discharge: 2024-01-13 | Disposition: A | Discharge status: Home / Self Care | Payer: Self-pay | Source: Ambulatory Visit | Attending: Family Medicine | Admitting: Family Medicine

## 2024-01-13 DIAGNOSIS — E782 Mixed hyperlipidemia: Secondary | ICD-10-CM | POA: Insufficient documentation
